# Patient Record
Sex: Female | Born: 1960 | Hispanic: Yes | Marital: Single | Smoking: Never smoker
Health system: Southern US, Community
[De-identification: ages and names within clinical notes are randomized; demographics above are authoritative.]

## PROBLEM LIST (undated history)

## (undated) DIAGNOSIS — J45909 Unspecified asthma, uncomplicated: Secondary | ICD-10-CM

## (undated) HISTORY — PX: CHOLECYSTECTOMY: SHX55

---

## 2004-06-03 ENCOUNTER — Emergency Department: Payer: Self-pay | Admitting: Emergency Medicine

## 2004-06-03 ENCOUNTER — Other Ambulatory Visit: Payer: Self-pay

## 2004-06-03 ENCOUNTER — Emergency Department: Payer: Self-pay | Admitting: General Practice

## 2004-06-07 ENCOUNTER — Emergency Department: Payer: Self-pay | Admitting: Unknown Physician Specialty

## 2004-06-07 ENCOUNTER — Other Ambulatory Visit: Payer: Self-pay

## 2004-06-29 ENCOUNTER — Other Ambulatory Visit: Payer: Self-pay

## 2004-06-29 ENCOUNTER — Emergency Department: Payer: Self-pay | Admitting: Emergency Medicine

## 2004-07-25 ENCOUNTER — Other Ambulatory Visit: Payer: Self-pay

## 2004-07-25 ENCOUNTER — Emergency Department: Payer: Self-pay | Admitting: Emergency Medicine

## 2004-11-14 ENCOUNTER — Emergency Department: Payer: Self-pay | Admitting: Emergency Medicine

## 2004-11-16 ENCOUNTER — Emergency Department: Payer: Self-pay | Admitting: Unknown Physician Specialty

## 2005-10-12 IMAGING — CR DG CHEST 2V
1 series · 2 of 2 positions shown · non-contrast
Comparison: none

REASON FOR EXAM: pain
COMMENTS:

PROCEDURE:     DXR - DXR CHEST PA (OR AP) AND LATERAL  - November 16, 2004  [DATE]
RESULT:       The lungs are clear.  Cardiovascular structures are
unremarkable.

[Series 1: view not recorded · 0.17mm/px · 2 of 2 slices shown]
[im 1/2]
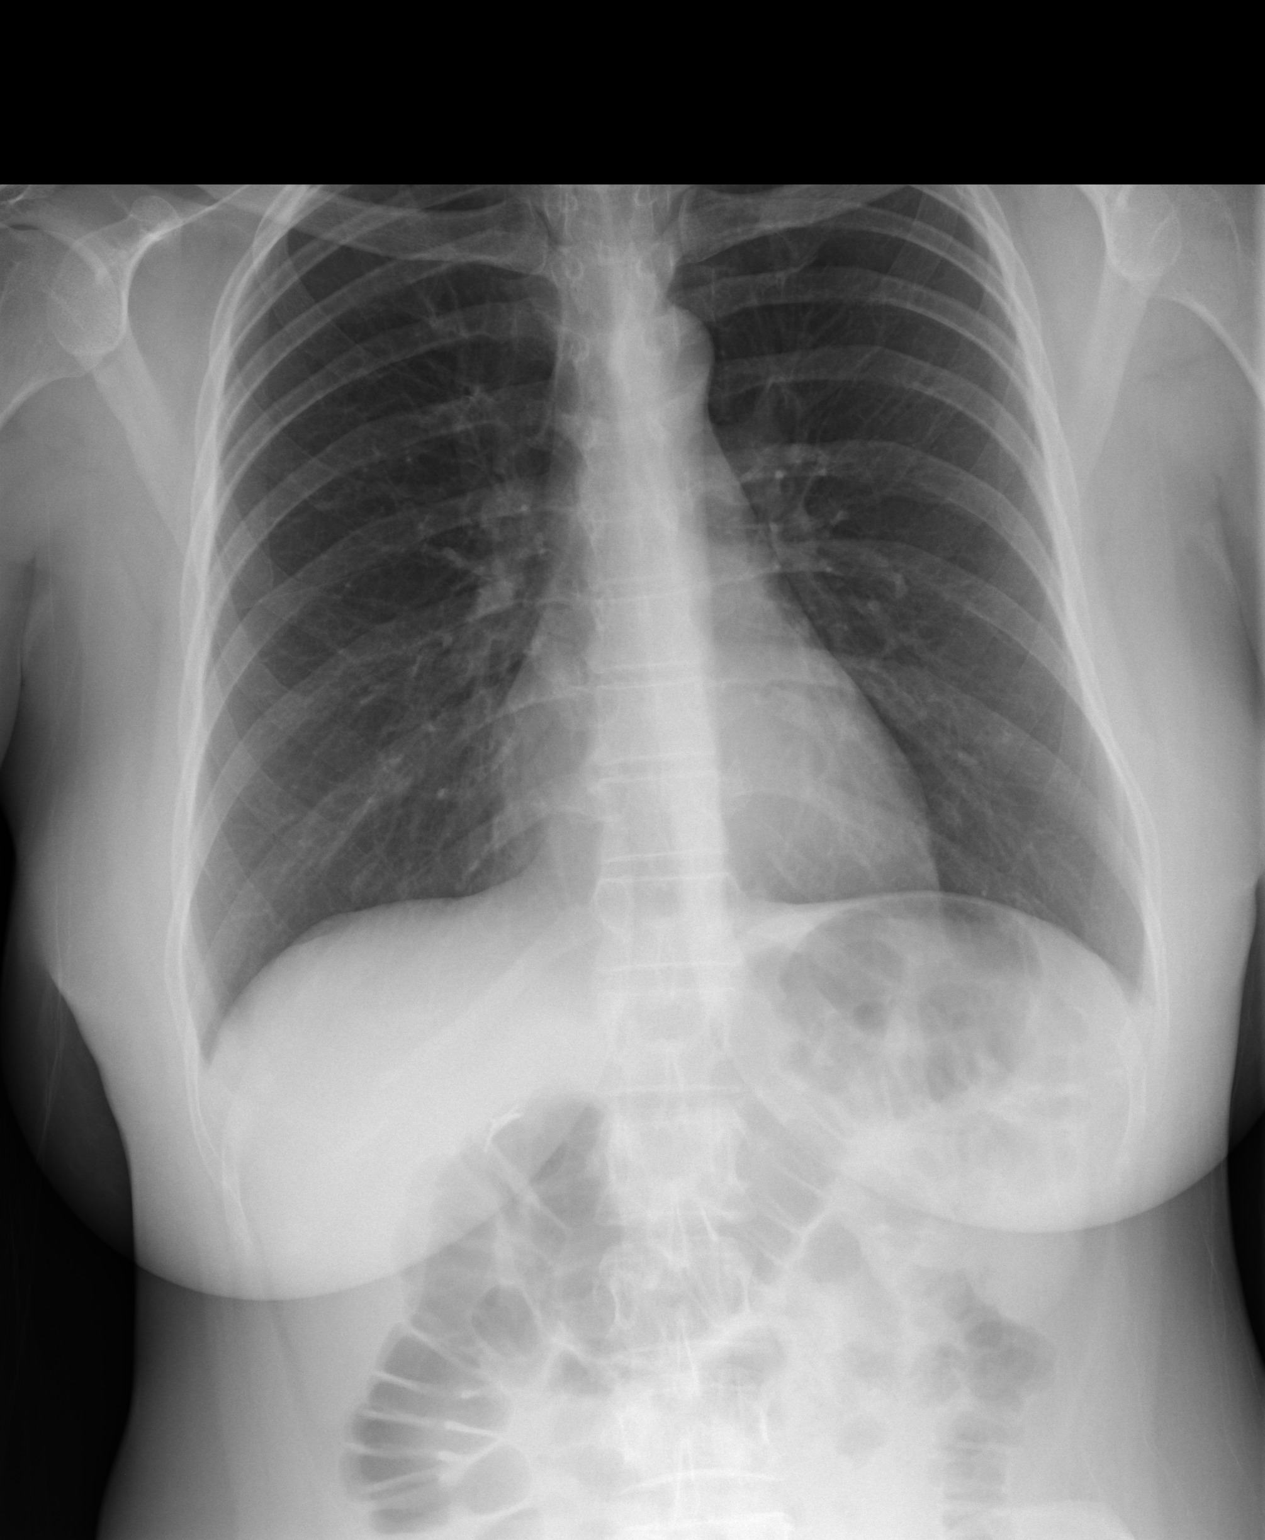
[im 2/2]
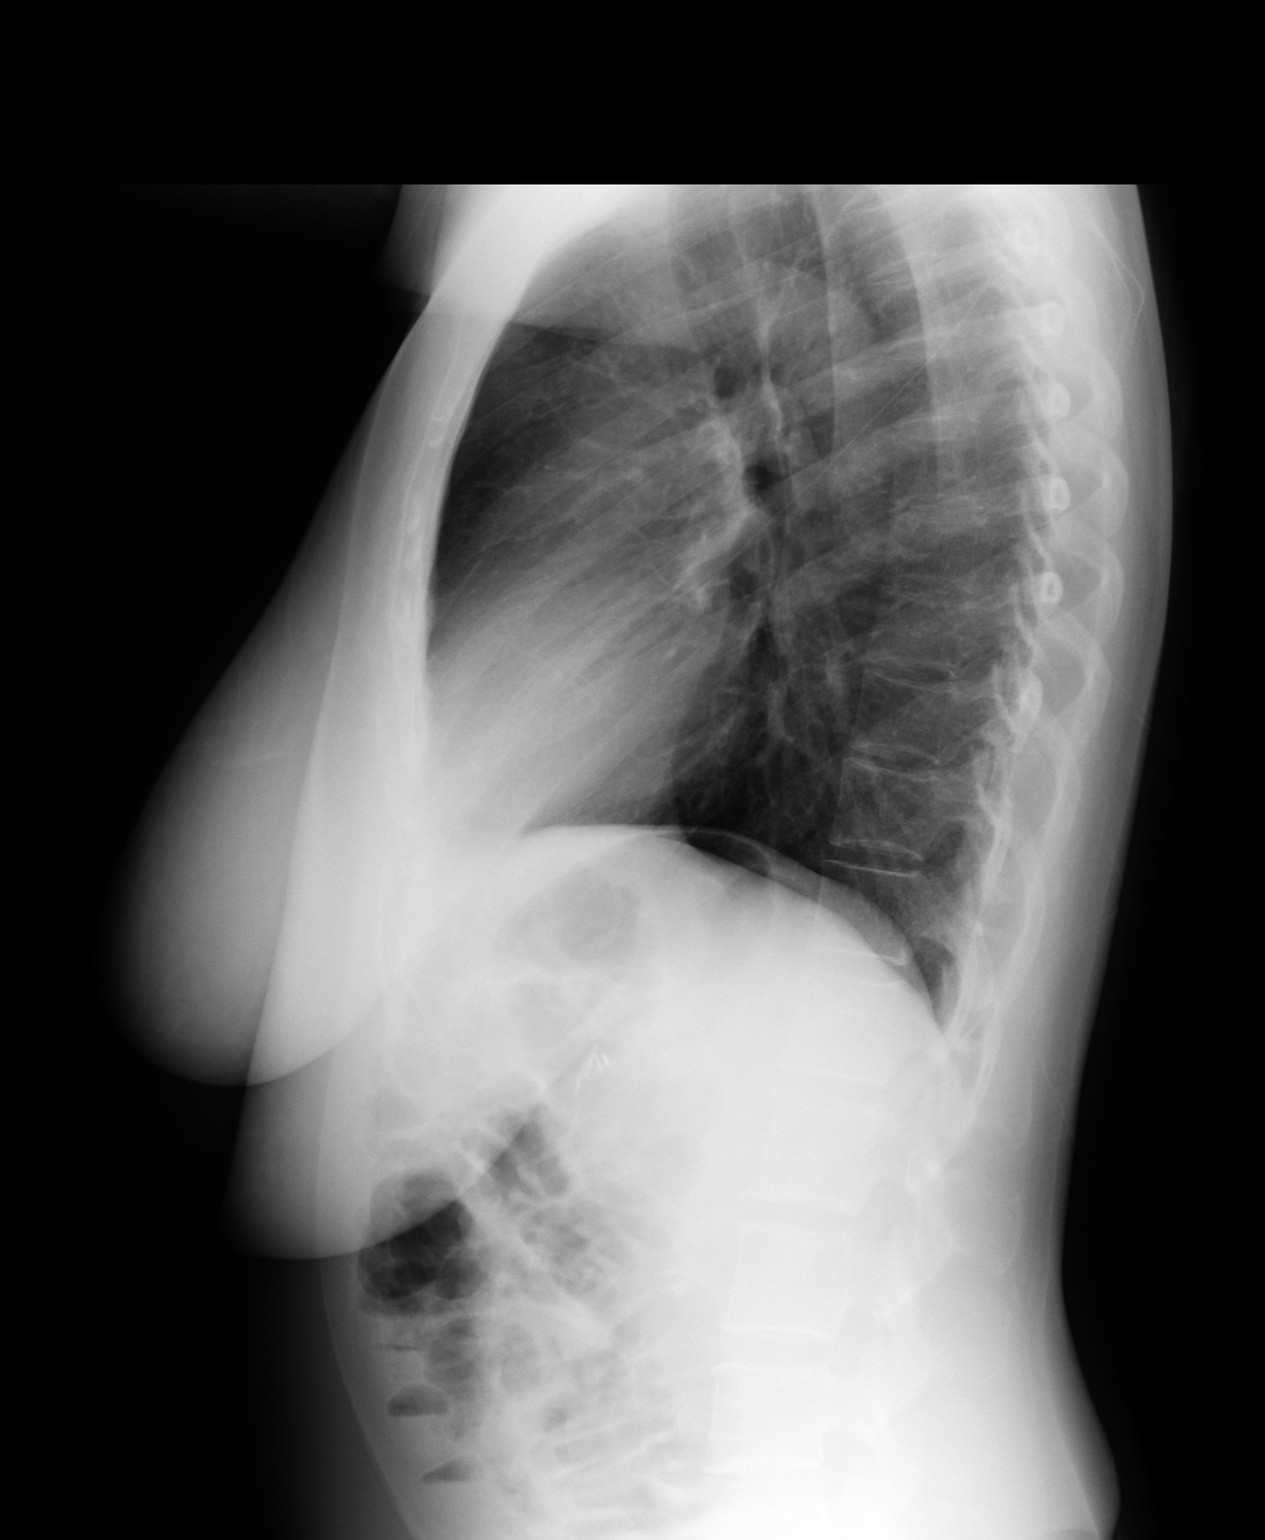

[2 of 2 positions shown; findings below may reference images not displayed]

IMPRESSION: No acute cardiopulmonary disease.

## 2006-01-20 ENCOUNTER — Emergency Department: Payer: Self-pay | Admitting: Emergency Medicine

## 2006-02-04 ENCOUNTER — Ambulatory Visit: Payer: Self-pay | Admitting: Family Medicine

## 2006-10-15 ENCOUNTER — Ambulatory Visit: Payer: Self-pay

## 2006-12-15 ENCOUNTER — Emergency Department: Payer: Self-pay | Admitting: Emergency Medicine

## 2007-03-18 ENCOUNTER — Other Ambulatory Visit: Payer: Self-pay

## 2007-03-18 ENCOUNTER — Emergency Department: Payer: Self-pay | Admitting: Emergency Medicine

## 2008-01-16 ENCOUNTER — Other Ambulatory Visit: Payer: Self-pay

## 2008-01-16 ENCOUNTER — Emergency Department: Payer: Self-pay | Admitting: Emergency Medicine

## 2008-03-17 ENCOUNTER — Ambulatory Visit: Payer: Self-pay | Admitting: Family Medicine

## 2008-10-09 ENCOUNTER — Inpatient Hospital Stay: Payer: Self-pay | Admitting: Internal Medicine

## 2013-08-29 ENCOUNTER — Emergency Department: Payer: Self-pay | Admitting: Emergency Medicine

## 2013-08-29 LAB — BASIC METABOLIC PANEL
Anion Gap: 4 — ABNORMAL LOW (ref 7–16)
BUN: 7 mg/dL (ref 7–18)
CO2: 28 mmol/L (ref 21–32)
Calcium, Total: 8.8 mg/dL (ref 8.5–10.1)
Chloride: 105 mmol/L (ref 98–107)
Creatinine: 0.73 mg/dL (ref 0.60–1.30)
EGFR (African American): >60
EGFR (Non-African Amer.): >60
Glucose: 88 mg/dL (ref 65–99)
Osmolality: 271 (ref 275–301)
Potassium: 3.5 mmol/L (ref 3.5–5.1)
Sodium: 137 mmol/L (ref 136–145)

## 2013-08-29 LAB — CBC
HCT: 40.5 % (ref 35.0–47.0)
HGB: 13.4 g/dL (ref 12.0–16.0)
MCH: 28.2 pg (ref 26.0–34.0)
MCHC: 33 g/dL (ref 32.0–36.0)
MCV: 85 fL (ref 80–100)
Platelet: 229 10*3/uL (ref 150–440)
RBC: 4.73 10*6/uL (ref 3.80–5.20)
RDW: 14.7 % — ABNORMAL HIGH (ref 11.5–14.5)
WBC: 7.7 10*3/uL (ref 3.6–11.0)

## 2013-08-29 LAB — TROPONIN I: Troponin-I: <0.02 ng/mL

## 2013-09-03 ENCOUNTER — Emergency Department: Payer: Self-pay | Admitting: Emergency Medicine

## 2013-09-04 LAB — URINALYSIS, COMPLETE
Bacteria: NONE SEEN
Bilirubin,UR: NEGATIVE
Blood: NEGATIVE
GLUCOSE, UR: NEGATIVE mg/dL (ref 0–75)
KETONE: NEGATIVE
Leukocyte Esterase: NEGATIVE
NITRITE: NEGATIVE
PH: 7 (ref 4.5–8.0)
Protein: NEGATIVE
RBC,UR: 1 /HPF (ref 0–5)
Specific Gravity: 1.014 (ref 1.003–1.030)
Squamous Epithelial: <1
WBC UR: <1 /HPF (ref 0–5)

## 2014-03-07 ENCOUNTER — Emergency Department: Payer: Self-pay | Admitting: Emergency Medicine

## 2014-03-07 LAB — COMPREHENSIVE METABOLIC PANEL
ANION GAP: 6 — AB (ref 7–16)
AST: 33 U/L (ref 15–37)
Albumin: 3.9 g/dL (ref 3.4–5.0)
Alkaline Phosphatase: 103 U/L
BUN: 11 mg/dL (ref 7–18)
Bilirubin,Total: 0.5 mg/dL (ref 0.2–1.0)
CALCIUM: 8.7 mg/dL (ref 8.5–10.1)
CREATININE: 0.84 mg/dL (ref 0.60–1.30)
Chloride: 107 mmol/L (ref 98–107)
Co2: 26 mmol/L (ref 21–32)
EGFR (African American): >60
GLUCOSE: 94 mg/dL (ref 65–99)
Osmolality: 277 (ref 275–301)
POTASSIUM: 3.9 mmol/L (ref 3.5–5.1)
SGPT (ALT): 30 U/L (ref 12–78)
Sodium: 139 mmol/L (ref 136–145)
TOTAL PROTEIN: 8.5 g/dL — AB (ref 6.4–8.2)

## 2014-03-07 LAB — CBC
HCT: 42 % (ref 35.0–47.0)
HGB: 13.2 g/dL (ref 12.0–16.0)
MCH: 27.5 pg (ref 26.0–34.0)
MCHC: 31.5 g/dL — ABNORMAL LOW (ref 32.0–36.0)
MCV: 87 fL (ref 80–100)
PLATELETS: 238 10*3/uL (ref 150–440)
RBC: 4.81 10*6/uL (ref 3.80–5.20)
RDW: 14.8 % — ABNORMAL HIGH (ref 11.5–14.5)
WBC: 7 10*3/uL (ref 3.6–11.0)

## 2014-03-07 LAB — TROPONIN I: Troponin-I: <0.02 ng/mL

## 2014-03-07 LAB — CK TOTAL AND CKMB (NOT AT ARMC)
CK, TOTAL: 105 U/L
CK-MB: 0.7 ng/mL (ref 0.5–3.6)

## 2014-06-05 ENCOUNTER — Emergency Department: Payer: Self-pay | Admitting: Emergency Medicine

## 2014-08-01 ENCOUNTER — Emergency Department: Payer: Self-pay | Admitting: Emergency Medicine

## 2015-01-25 ENCOUNTER — Ambulatory Visit: Payer: Self-pay

## 2015-06-21 ENCOUNTER — Ambulatory Visit: Payer: Self-pay | Attending: Oncology

## 2015-06-21 ENCOUNTER — Ambulatory Visit
Admission: RE | Admit: 2015-06-21 | Discharge: 2015-06-21 | Disposition: A | Discharge status: Home / Self Care | Payer: Self-pay | Source: Ambulatory Visit | Attending: Oncology | Admitting: Oncology

## 2015-06-21 VITALS — BP 122/83 | HR 99 | Temp 98.4°F | Resp 18 | Ht 61.02 in | Wt 144.2 lb

## 2015-06-21 DIAGNOSIS — Z0000 Encounter for general adult medical examination without abnormal findings: Secondary | ICD-10-CM

## 2015-06-21 NOTE — Progress Notes (Signed)
Subjective:     Patient ID: Vicki Ellis, female   DOB: 1960-12-17, 54 y.o.   MRN: [id number]  HPI   Review of Systems     Objective:   Physical Exam  Pulmonary/Chest: Right breast exhibits no inverted nipple, no mass, no nipple discharge, no skin change and no tenderness. Left breast exhibits no inverted nipple, no mass, no nipple discharge, no skin change and no tenderness. Breasts are asymmetrical.  Right breast 1/2 cup size larger than left       Assessment:  54 year old hispanic patient , fluent in [place],  presents for [facility] clinic visit. Patient screened, and meets [facility] eligibility.  Patient does not have insurance, Medicare or Medicaid.  Handout given on Affordable Care Act.  CBE unremarkable.  Instructed patient on breast self-exam using teach back method    Plan:     Sent for bilateral screening mammogram.

## 2015-07-04 NOTE — Progress Notes (Signed)
Letter mailed from [facility] to notify of normal mammogram results.  Patient to return in one year for annual screening.  Copy to [facility]. 

## 2015-08-31 ENCOUNTER — Emergency Department
Admission: EM | Admit: 2015-08-31 | Discharge: 2015-09-01 | Disposition: A | Discharge status: Home / Self Care | Payer: Self-pay | Attending: Emergency Medicine | Admitting: Emergency Medicine

## 2015-08-31 ENCOUNTER — Emergency Department: Payer: Self-pay

## 2015-08-31 DIAGNOSIS — R112 Nausea with vomiting, unspecified: Secondary | ICD-10-CM | POA: Insufficient documentation

## 2015-08-31 DIAGNOSIS — R0781 Pleurodynia: Secondary | ICD-10-CM | POA: Insufficient documentation

## 2015-08-31 DIAGNOSIS — R059 Cough, unspecified: Secondary | ICD-10-CM

## 2015-08-31 DIAGNOSIS — R05 Cough: Secondary | ICD-10-CM | POA: Insufficient documentation

## 2015-08-31 DIAGNOSIS — R197 Diarrhea, unspecified: Secondary | ICD-10-CM | POA: Insufficient documentation

## 2015-08-31 DIAGNOSIS — R109 Unspecified abdominal pain: Secondary | ICD-10-CM | POA: Insufficient documentation

## 2015-08-31 HISTORY — DX: Unspecified asthma, uncomplicated: J45.909

## 2015-08-31 LAB — COMPREHENSIVE METABOLIC PANEL
ALT: 20 U/L (ref 14–54)
AST: 23 U/L (ref 15–41)
Albumin: 4.4 g/dL (ref 3.5–5.0)
Alkaline Phosphatase: 111 U/L (ref 38–126)
Anion gap: 8 (ref 5–15)
BUN: 8 mg/dL (ref 6–20)
CHLORIDE: 104 mmol/L (ref 101–111)
CO2: 27 mmol/L (ref 22–32)
CREATININE: 0.74 mg/dL (ref 0.44–1.00)
Calcium: 9.5 mg/dL (ref 8.9–10.3)
GFR calc non Af Amer: >60 mL/min (ref >60)
Glucose, Bld: 99 mg/dL (ref 65–99)
Potassium: 3.8 mmol/L (ref 3.5–5.1)
SODIUM: 139 mmol/L (ref 135–145)
Total Bilirubin: 0.7 mg/dL (ref 0.3–1.2)
Total Protein: 8.5 g/dL — ABNORMAL HIGH (ref 6.5–8.1)

## 2015-08-31 LAB — URINALYSIS COMPLETE WITH MICROSCOPIC (ARMC ONLY)
BILIRUBIN URINE: NEGATIVE
Glucose, UA: NEGATIVE mg/dL
HGB URINE DIPSTICK: NEGATIVE
Ketones, ur: NEGATIVE mg/dL
LEUKOCYTES UA: NEGATIVE
NITRITE: NEGATIVE
PH: 7 (ref 5.0–8.0)
PROTEIN: NEGATIVE mg/dL
Specific Gravity, Urine: 1.004 — ABNORMAL LOW (ref 1.005–1.030)

## 2015-08-31 LAB — CBC
HCT: 43.6 % (ref 35.0–47.0)
Hemoglobin: 14.2 g/dL (ref 12.0–16.0)
MCH: 27.9 pg (ref 26.0–34.0)
MCHC: 32.6 g/dL (ref 32.0–36.0)
MCV: 85.5 fL (ref 80.0–100.0)
PLATELETS: 232 10*3/uL (ref 150–440)
RBC: 5.1 MIL/uL (ref 3.80–5.20)
RDW: 14.4 % (ref 11.5–14.5)
WBC: 7.6 10*3/uL (ref 3.6–11.0)

## 2015-08-31 LAB — LIPASE, BLOOD: Lipase: 36 U/L (ref 11–51)

## 2015-08-31 MED ORDER — ONDANSETRON HCL 4 MG/2ML IJ SOLN
4.0000 mg | Freq: Once | INTRAMUSCULAR | Status: AC
  Administered 2015-08-31: 4 mg via INTRAVENOUS
  Filled 2015-08-31: qty 2

## 2015-08-31 MED ORDER — KETOROLAC TROMETHAMINE 30 MG/ML IJ SOLN
30.0000 mg | Freq: Once | INTRAMUSCULAR | Status: AC
Start: 2015-08-31 — End: 2015-08-31
  Administered 2015-08-31: 30 mg via INTRAVENOUS
  Filled 2015-08-31: qty 1

## 2015-08-31 MED ORDER — SODIUM CHLORIDE 0.9 % IV BOLUS (SEPSIS)
500.0000 mL | Freq: Once | INTRAVENOUS | Status: AC
  Administered 2015-08-31: 500 mL via INTRAVENOUS

## 2015-08-31 NOTE — ED Notes (Signed)
Patient transported to X-ray 

## 2015-08-31 NOTE — ED Notes (Addendum)
Flank pain on left side, radiates into back and goes down left leg. Present x 3 days. Denies dysuria or hematuria. Vomiting today x 1. Denies  Fever.

## 2015-08-31 NOTE — ED Provider Notes (Signed)
[facility] Emergency Department Provider Note  ____________________________________________  Time seen: Approximately 11:21 PM  I have reviewed the triage vital signs and the nursing notes.   HISTORY  Chief Complaint Flank Pain    HPI Ksenia Kunz is a 55 y.o. female who presents to the ED from home with a chief complaint of left side pain. Patient complains of left side pain 3 days. Describes sharp pain to left lower ribs and left side which occasionally radiates into her back and goes down her left leg. Denies recent travel or trauma. Vomited 1 today. Denies associated fever, chills, chest pain, shortness of breath, dysuria, hematuria. Does complain of a nonproductive cough which was worse last week as well as diarrhea. Nothing makes her symptoms better. Movement coughing make her symptoms worse.Denies history of kidney stones.   Past Medical History  Diagnosis Date  . Asthma     There are no active problems to display for this patient.   Past Surgical History  Procedure Laterality Date  . Cesarean section    . Cholecystectomy      No current outpatient prescriptions on file.  Allergies Oxycodone-acetaminophen; Propoxyphene; and Tramadol  No family history on file.  Social History Social History  Substance Use Topics  . Smoking status: Never Smoker   . Smokeless tobacco: Never Used  . Alcohol Use: 0.0 oz/week    0 Standard drinks or equivalent per week    Review of Systems Constitutional: No fever/chills Eyes: No visual changes. ENT: No sore throat. Cardiovascular: Denies chest pain. Respiratory: Denies shortness of breath. Gastrointestinal: Positive for abdominal pain.  Positive for nausea and vomiting x 1.  Positive for diarrhea.  No constipation. Genitourinary: Negative for dysuria. Musculoskeletal: Negative for back pain. Skin: Negative for rash. Neurological: Negative for headaches, focal weakness or numbness.  10-point  ROS otherwise negative.  ____________________________________________   PHYSICAL EXAM:  VITAL SIGNS: ED Triage Vitals  Enc Vitals Group     BP 08/31/15 2204 143/86 mmHg     Pulse Rate 08/31/15 2204 95     Resp 08/31/15 2204 20     Temp 08/31/15 2204 98.8 F (37.1 C)     Temp Source 08/31/15 2204 Oral     SpO2 08/31/15 2204 98 %     Weight 08/31/15 2204 155 lb (70.308 kg)     Height 08/31/15 2204 5\' 5"  (1.651 m)     Head Cir --      Peak Flow --      Pain Score 08/31/15 2205 9     Pain Loc --      Pain Edu? --      Excl. in GC? --     Constitutional: Alert and oriented. Well appearing and in no acute distress. Eyes: Conjunctivae are normal. PERRL. EOMI. Head: Atraumatic. Nose: No congestion/rhinnorhea. Mouth/Throat: Mucous membranes are moist.  Oropharynx non-erythematous. Neck: No stridor.   Cardiovascular: Normal rate, regular rhythm. Grossly normal heart sounds.  Good peripheral circulation. Respiratory: Normal respiratory effort.  No retractions. Lungs CTAB. Left lower ribs tender to palpation. Pain on movement of trunk. Gastrointestinal: Soft and nontender to light and deep palpation. No distention. No abdominal bruits. No CVA tenderness. Musculoskeletal: Back is nontender to palpation or to movements. No lower extremity tenderness nor edema.  No joint effusions. Neurologic:  Normal speech and language. No gross focal neurologic deficits are appreciated. No gait instability. Skin:  Skin is warm, dry and intact. No rash noted. Psychiatric: Mood and affect are  normal. Speech and behavior are normal.  ____________________________________________   LABS (all labs ordered are listed, but only abnormal results are displayed)  Labs Reviewed  URINALYSIS COMPLETEWITH MICROSCOPIC ([facility] ONLY) - Abnormal; Notable for the following:    Color, Urine STRAW (*)    APPearance CLEAR (*)    Specific Gravity, Urine 1.004 (*)    Bacteria, UA RARE (*)    Squamous Epithelial / LPF  0-5 (*)    All other components within normal limits  COMPREHENSIVE METABOLIC PANEL - Abnormal; Notable for the following:    Total Protein 8.5 (*)    All other components within normal limits  CBC  LIPASE, BLOOD   ____________________________________________  EKG  None ____________________________________________  RADIOLOGY  Chest 2 view (view by me, interpreted per Dr. Gwenyth Benderadparvar): No active cardiopulmonary disease. ____________________________________________   PROCEDURES  Procedure(s) performed: None  Critical Care performed: No  ____________________________________________   INITIAL IMPRESSION / ASSESSMENT AND PLAN / ED COURSE  Pertinent labs & imaging results that were available during my care of the patient were reviewed by me and considered in my medical decision making (see chart for details).  55 year old female who presents with left lower rib pain and coughing. Laboratory and urinalysis results are unremarkable. Will obtain chest x-ray; administer analgesia and reassess.  ----------------------------------------- 1:09 AM on 09/01/2015 -----------------------------------------  Patient is feeling much better, smiling and joking with family members. Updated patient of imaging results. Strict precautions given. Patient and family verbalize understanding and agree with plan of care. ____________________________________________   FINAL CLINICAL IMPRESSION(S) / ED DIAGNOSES  Final diagnoses:  Rib pain on left side  Cough      Irean HongJade J Sung, MD 09/01/15 [phone]

## 2015-09-01 MED ORDER — NAPROXEN 500 MG PO TABS: 500.0000 mg | ORAL_TABLET | Freq: Two times a day (BID) | ORAL | Status: AC

## 2015-09-01 NOTE — Discharge Instructions (Signed)
1. You may take Naprosyn as needed for pain. 2. Apply moist heat to affected area several times daily. 3. Return to the ER for worsening symptoms, persistent vomiting, difficulty breathing or other concerns.  Dolor en la pared torcica (Chest Wall Pain) El dolor en la pared torcica se produce en los huesos y los msculos del pecho o alrededor de Scientist, product/process development. A veces, una lesin Occupational psychologist. En ocasiones, la causa puede ser desconocida. Este dolor puede durar varias semanas. INSTRUCCIONES PARA EL CUIDADO EN EL HOGAR  Est atento a cualquier cambio en los sntomas. Tome estas medidas para Acupuncturist dolor:   Haga reposo como se lo haya indicado el mdico.   Evite las actividades que causan dolor. Estas pueden ser [employer] requieren el uso de los msculos del trax, los abdominales o los laterales para levantar objetos pesados.   Si se lo indican, aplique hielo sobre la zona dolorida:  Ponga el hielo en una bolsa plstica.  Coloque una toalla entre la piel y la bolsa de hielo.  Coloque el hielo durante , 2 a 3veces por Futures trader.  Tome los medicamentos de venta libre y los recetados solamente como se lo haya indicado el mdico.  No consuma productos que contengan tabaco, incluidos cigarrillos, tabaco de Theatre manager y Administrator, Civil Service. Si necesita ayuda para dejar de fumar, consulte al mdico.  Concurra a todas las visitas de control como se lo haya indicado el mdico. Esto es importante. SOLICITE ATENCIN MDICA SI:  Lance Muss.  El dolor de [place].  Aparecen nuevos sntomas. SOLICITE ATENCIN MDICA DE INMEDIATO SI:  Tiene nuseas o vmitos.  Berenice Primas o tiene sensacin de desvanecimiento.  Tiene tos con flema (esputo) o expectora sangre al toser.  Le falta el aire.   Esta informacin no tiene Theme park manager el consejo del mdico. Asegrese de hacerle al mdico cualquier pregunta que tenga.   Document Released: 09/16/2006 Document Revised:  04/26/2015 Elsevier Interactive Patient Education 2016 [employer].  Tos en los adultos (Cough, Adult) La tos ayuda a limpiar la garganta y los pulmones. La tos puede durar solo 2 o 3semanas (aguda) o ms de 8semanas (crnica). Las causas de la tos son [place]. Puede ser el signo de [place] enfermedad o de otro trastorno. CUIDADOS EN EL HOGAR  Est atento a cualquier cambio en la tos.  Tome los medicamentos solamente como se lo haya indicado el mdico.  Si le recetaron un antibitico, tmelo como se lo haya indicado el mdico. No deje de tomarlo aunque comience a sentirse mejor.  Hable con el mdico antes de probar un medicamento para la tos.  Beba suficiente lquido para mantener el pis (orina) claro o de color amarillo plido.  Si el aire est seco, use un vaporizador o un humidificador con vapor fro en su casa.  Mantngase alejado de las cosas que lo hacen toser en el trabajo o en su casa.  Si la tos aumenta durante la noche, haga la prueba de usar almohadas adicionales para Pharmacologist la cabeza ms elevada mientras duerme.  No fume e intente no estar cerca de humo. Si necesita ayuda para dejar de fumar, consulte al mdico.  No consuma cafena.  No beba alcohol.  Descanse todo lo que sea necesario. SOLICITE AYUDA SI:  Le aparecen problemas (sntomas) nuevos.  Expectora un lquido amarillento (pus) cuando tose.  La tos no mejora despus de 2 o 3semanas, o empeora.  Los medicamentos no [employer] tos, y no Stage manager.  Siente un dolor que se vuelve ms intenso o que no se BJ'salivia con los medicamentos.  Tiene fiebre.  Est bajando de peso y no sabe por qu.  Tiene transpiracin nocturna. SOLICITE AYUDA DE INMEDIATO SI:  Tose y escupe sangre.  Tiene dificultad para respirar.  Los latidos cardacos son muy rpidos.   Esta informacin no tiene Theme park managercomo fin reemplazar el consejo del mdico. Asegrese de hacerle al mdico cualquier pregunta que tenga.     Document Released: 04/18/2011 Document Revised: 04/26/2015 Elsevier Interactive Patient Education [employer]2016 Elsevier Inc.

## 2015-09-12 ENCOUNTER — Inpatient Hospital Stay
Admission: EM | Admit: 2015-09-12 | Discharge: 2015-09-14 | DRG: 057 | Disposition: A | Discharge status: Home / Self Care | Payer: Self-pay | Attending: Internal Medicine | Admitting: Internal Medicine

## 2015-09-12 ENCOUNTER — Emergency Department: Payer: Self-pay

## 2015-09-12 ENCOUNTER — Inpatient Hospital Stay: Payer: Self-pay

## 2015-09-12 ENCOUNTER — Encounter: Payer: Self-pay | Admitting: Emergency Medicine

## 2015-09-12 DIAGNOSIS — G43909 Migraine, unspecified, not intractable, without status migrainosus: Secondary | ICD-10-CM | POA: Diagnosis present

## 2015-09-12 DIAGNOSIS — R531 Weakness: Secondary | ICD-10-CM | POA: Diagnosis present

## 2015-09-12 DIAGNOSIS — G8194 Hemiplegia, unspecified affecting left nondominant side: Principal | ICD-10-CM | POA: Diagnosis present

## 2015-09-12 DIAGNOSIS — F419 Anxiety disorder, unspecified: Secondary | ICD-10-CM | POA: Diagnosis present

## 2015-09-12 DIAGNOSIS — J45909 Unspecified asthma, uncomplicated: Secondary | ICD-10-CM | POA: Diagnosis present

## 2015-09-12 DIAGNOSIS — Z9049 Acquired absence of other specified parts of digestive tract: Secondary | ICD-10-CM

## 2015-09-12 DIAGNOSIS — Z79899 Other long term (current) drug therapy: Secondary | ICD-10-CM

## 2015-09-12 DIAGNOSIS — I639 Cerebral infarction, unspecified: Secondary | ICD-10-CM

## 2015-09-12 DIAGNOSIS — Z8249 Family history of ischemic heart disease and other diseases of the circulatory system: Secondary | ICD-10-CM

## 2015-09-12 DIAGNOSIS — Z7982 Long term (current) use of aspirin: Secondary | ICD-10-CM

## 2015-09-12 LAB — CBC WITH DIFFERENTIAL/PLATELET
BASOS PCT: 1 %
Basophils Absolute: 0 10*3/uL (ref 0–0.1)
EOS ABS: 0.1 10*3/uL (ref 0–0.7)
Eosinophils Relative: 1 %
HCT: 41.7 % (ref 35.0–47.0)
HEMOGLOBIN: 13.6 g/dL (ref 12.0–16.0)
Lymphocytes Relative: 36 %
Lymphs Abs: 2.4 10*3/uL (ref 1.0–3.6)
MCH: 27.8 pg (ref 26.0–34.0)
MCHC: 32.7 g/dL (ref 32.0–36.0)
MCV: 84.9 fL (ref 80.0–100.0)
MONO ABS: 0.6 10*3/uL (ref 0.2–0.9)
MONOS PCT: 9 %
NEUTROS PCT: 53 %
Neutro Abs: 3.5 10*3/uL (ref 1.4–6.5)
Platelets: 222 10*3/uL (ref 150–440)
RBC: 4.91 MIL/uL (ref 3.80–5.20)
RDW: 14.1 % (ref 11.5–14.5)
WBC: 6.6 10*3/uL (ref 3.6–11.0)

## 2015-09-12 LAB — URINALYSIS COMPLETE WITH MICROSCOPIC (ARMC ONLY)
BILIRUBIN URINE: NEGATIVE
Bacteria, UA: NONE SEEN
GLUCOSE, UA: NEGATIVE mg/dL
Hgb urine dipstick: NEGATIVE
KETONES UR: NEGATIVE mg/dL
Leukocytes, UA: NEGATIVE
Nitrite: NEGATIVE
Protein, ur: NEGATIVE mg/dL
SPECIFIC GRAVITY, URINE: 1.019 (ref 1.005–1.030)
pH: 7 (ref 5.0–8.0)

## 2015-09-12 LAB — COMPREHENSIVE METABOLIC PANEL
ALK PHOS: 106 U/L (ref 38–126)
ALT: 18 U/L (ref 14–54)
AST: 21 U/L (ref 15–41)
Albumin: 4.3 g/dL (ref 3.5–5.0)
Anion gap: 9 (ref 5–15)
BILIRUBIN TOTAL: 0.7 mg/dL (ref 0.3–1.2)
BUN: 10 mg/dL (ref 6–20)
CALCIUM: 9.4 mg/dL (ref 8.9–10.3)
CO2: 27 mmol/L (ref 22–32)
CREATININE: 0.64 mg/dL (ref 0.44–1.00)
Chloride: 104 mmol/L (ref 101–111)
GFR calc non Af Amer: >60 mL/min (ref >60)
Glucose, Bld: 102 mg/dL — ABNORMAL HIGH (ref 65–99)
Potassium: 3.8 mmol/L (ref 3.5–5.1)
SODIUM: 140 mmol/L (ref 135–145)
Total Protein: 8.1 g/dL (ref 6.5–8.1)

## 2015-09-12 LAB — CK: CK TOTAL: 61 U/L (ref 38–234)

## 2015-09-12 MED ORDER — DIAZEPAM 5 MG/ML IJ SOLN
2.5000 mg | Freq: Once | INTRAMUSCULAR | Status: AC
  Administered 2015-09-12: 2.5 mg via INTRAVENOUS
  Filled 2015-09-12: qty 2

## 2015-09-12 MED ORDER — ENOXAPARIN SODIUM 40 MG/0.4ML ~~LOC~~ SOLN
40.0000 mg | SUBCUTANEOUS | Status: DC
  Administered 2015-09-12 – 2015-09-13 (×2): 40 mg via SUBCUTANEOUS
  Filled 2015-09-12 (×3): qty 0.4

## 2015-09-12 MED ORDER — DIPHENHYDRAMINE HCL 50 MG/ML IJ SOLN
INTRAMUSCULAR | Status: AC
  Administered 2015-09-12: 50 mg via INTRAVENOUS
  Filled 2015-09-12: qty 1

## 2015-09-12 MED ORDER — IBUPROFEN 400 MG PO TABS
400.0000 mg | ORAL_TABLET | Freq: Four times a day (QID) | ORAL | Status: DC | PRN
  Administered 2015-09-12 – 2015-09-13 (×2): 400 mg via ORAL
  Filled 2015-09-12 (×3): qty 1

## 2015-09-12 MED ORDER — STROKE: EARLY STAGES OF RECOVERY BOOK
Freq: Once | Status: AC
  Administered 2015-09-12: 18:00:00

## 2015-09-12 MED ORDER — INFLUENZA VAC SPLIT QUAD 0.5 ML IM SUSY
0.5000 mL | PREFILLED_SYRINGE | INTRAMUSCULAR | Status: AC
  Administered 2015-09-13: 11:00:00 0.5 mL via INTRAMUSCULAR
  Filled 2015-09-12: qty 0.5

## 2015-09-12 MED ORDER — TRAZODONE HCL 100 MG PO TABS
100.0000 mg | ORAL_TABLET | Freq: Every day | ORAL | Status: DC
  Administered 2015-09-12: 22:00:00 100 mg via ORAL
  Filled 2015-09-12 (×2): qty 1

## 2015-09-12 MED ORDER — DIPHENHYDRAMINE HCL 50 MG/ML IJ SOLN
50.0000 mg | Freq: Once | INTRAMUSCULAR | Status: AC
  Administered 2015-09-12: 50 mg via INTRAVENOUS

## 2015-09-12 MED ORDER — SODIUM CHLORIDE 0.9 % IV BOLUS (SEPSIS)
1000.0000 mL | Freq: Once | INTRAVENOUS | Status: AC
  Administered 2015-09-12: 1000 mL via INTRAVENOUS

## 2015-09-12 MED ORDER — IOHEXOL 350 MG/ML SOLN
100.0000 mL | Freq: Once | INTRAVENOUS | Status: AC | PRN
  Administered 2015-09-12: 100 mL via INTRAVENOUS

## 2015-09-12 MED ORDER — ASPIRIN 81 MG PO CHEW
324.0000 mg | CHEWABLE_TABLET | Freq: Once | ORAL | Status: AC
  Administered 2015-09-12: 324 mg via ORAL
  Filled 2015-09-12: qty 4

## 2015-09-12 MED ORDER — METHYLPREDNISOLONE SODIUM SUCC 125 MG IJ SOLR
INTRAMUSCULAR | Status: AC
  Administered 2015-09-12: 125 mg via INTRAVENOUS
  Filled 2015-09-12: qty 2

## 2015-09-12 MED ORDER — METHYLPREDNISOLONE SODIUM SUCC 125 MG IJ SOLR
125.0000 mg | Freq: Once | INTRAMUSCULAR | Status: AC
  Administered 2015-09-12: 125 mg via INTRAVENOUS

## 2015-09-12 NOTE — H&P (Signed)
[facility] - [place] [facility]   PATIENT NAME: Vicki Ellis    MR#:  [id number]  DATE OF BIRTH:  1961-05-19  DATE OF ADMISSION:  09/12/2015  PRIMARY CARE PHYSICIAN: Sandrea Hughs, NP   REQUESTING/REFERRING PHYSICIAN: Sharman Cheek M.D.  CHIEF COMPLAINT:   Chief Complaint  Patient presents with  . Weakness    HISTORY OF PRESENT ILLNESS: Lachell Rochette  is a 55 y.o. female with a known history of asthma who presents with complaint of left-sided weakness ongoing for 2 weeks. She reports that she is still able to ambulate. But has trouble with gripping things in her hand. Patient also complains of numbness on the left upper extremity and left lower extremity. CT scan of the head was negative in the ER.  PAST MEDICAL HISTORY:   Past Medical History  Diagnosis Date  . Asthma     PAST SURGICAL HISTORY: Past Surgical History  Procedure Laterality Date  . Cesarean section    . Cholecystectomy      SOCIAL HISTORY:  Social History  Substance Use Topics  . Smoking status: Never Smoker   . Smokeless tobacco: Never Used  . Alcohol Use: 0.6 oz/week    1 Standard drinks or equivalent per week    FAMILY HISTORY:  Family History  Problem Relation Age of Onset  . CAD      DRUG ALLERGIES:  Allergies  Allergen Reactions  . Oxycodone-Acetaminophen Other (See Comments)    Reaction:  Tachycardia  . Propoxyphene Other (See Comments)    Reaction:  Tachycardia  . Acyclovir And Related Other (See Comments)    Reaction:  Unknown   . Iohexol Hives  . Shellfish Allergy Hives  . Tramadol Rash    REVIEW OF SYSTEMS:   CONSTITUTIONAL: No fever, fatigue or weakness.  EYES: No blurred or double vision.  EARS, NOSE, AND THROAT: No tinnitus or ear pain.  RESPIRATORY: No cough, shortness of breath, wheezing or hemoptysis.  CARDIOVASCULAR: No chest pain, orthopnea, edema.  GASTROINTESTINAL: No nausea, vomiting, diarrhea or abdominal pain.  GENITOURINARY: No  dysuria, hematuria.  ENDOCRINE: No polyuria, nocturia,  HEMATOLOGY: No anemia, easy bruising or bleeding SKIN: No rash or lesion. MUSCULOSKELETAL: No joint pain or arthritis.   NEUROLOGIC: Left upper extremity and left lower extremity weakness and numness PSYCHIATRY: No anxiety or depression.   MEDICATIONS AT HOME:  Prior to Admission medications   Medication Sig Start Date End Date Taking? Authorizing Provider  aspirin EC 81 MG tablet Take 162 mg by mouth daily as needed (for chest pain).   Yes Historical Provider, MD  traZODone (DESYREL) 100 MG tablet Take 100 mg by mouth at bedtime.   Yes Historical Provider, MD  naproxen (NAPROSYN) 500 MG tablet Take 1 tablet (500 mg total) by mouth 2 (two) times daily with a meal. Patient not taking: Reported on 09/12/2015 09/01/15   Irean Hong, MD      PHYSICAL EXAMINATION:   VITAL SIGNS: Blood pressure 116/73, pulse 91, temperature 98.8 F (37.1 C), temperature source Oral, resp. rate 18, height 5\' 2"  (1.575 m), weight 64.864 kg (143 lb), last menstrual period 05/22/2015, SpO2 96 %.  GENERAL:  55 y.o.-year-old patient lying in the bed with no acute distress.  EYES: Pupils equal, round, reactive to light and accommodation. No scleral icterus. Extraocular muscles intact.  HEENT: Head atraumatic, normocephalic. Oropharynx and nasopharynx clear.  NECK:  Supple, no jugular venous distention. No thyroid enlargement, no tenderness.  LUNGS: Normal breath sounds  bilaterally, no wheezing, rales,rhonchi or crepitation. No use of accessory muscles of respiration.  CARDIOVASCULAR: S1, S2 normal. No murmurs, rubs, or gallops.  ABDOMEN: Soft, nontender, nondistended. Bowel sounds present. No organomegaly or mass.  EXTREMITIES: No pedal edema, cyanosis, or clubbing.  NEUROLOGIC: Cranial nerves II through XII are intact. Diminished strength in the left upper extremity and lower extremity unclear if this effort base or true pathology Babinski downgoing,   PSYCHIATRIC: The patient is alert and oriented x 3.  SKIN: No obvious rash, lesion, or ulcer.   LABORATORY PANEL:   CBC  Recent Labs Lab 09/12/15 1240  WBC 6.6  HGB 13.6  HCT 41.7  PLT 222  MCV 84.9  MCH 27.8  MCHC 32.7  RDW 14.1  LYMPHSABS 2.4  MONOABS 0.6  EOSABS 0.1  BASOSABS 0.0   ------------------------------------------------------------------------------------------------------------------  Chemistries   Recent Labs Lab 09/12/15 1240  NA 140  K 3.8  CL 104  CO2 27  GLUCOSE 102*  BUN 10  CREATININE 0.64  CALCIUM 9.4  AST 21  ALT 18  ALKPHOS 106  BILITOT 0.7   ------------------------------------------------------------------------------------------------------------------ estimated creatinine clearance is 71.9 mL/min (by C-G formula based on Cr of 0.64). ------------------------------------------------------------------------------------------------------------------ No results for input(s): TSH, T4TOTAL, T3FREE, THYROIDAB in the last 72 hours.  Invalid input(s): FREET3   Coagulation profile No results for input(s): INR, PROTIME in the last 168 hours. ------------------------------------------------------------------------------------------------------------------- No results for input(s): DDIMER in the last 72 hours. -------------------------------------------------------------------------------------------------------------------  Cardiac Enzymes No results for input(s): CKMB, TROPONINI, MYOGLOBIN in the last 168 hours.  Invalid input(s): CK ------------------------------------------------------------------------------------------------------------------ Invalid input(s): POCBNP  ---------------------------------------------------------------------------------------------------------------  Urinalysis    Component Value Date/Time   COLORURINE STRAW* 09/12/2015 1349   COLORURINE Yellow 09/04/2013 0044   APPEARANCEUR CLEAR* 09/12/2015  1349   APPEARANCEUR Clear 09/04/2013 0044   LABSPEC 1.019 09/12/2015 1349   LABSPEC 1.014 09/04/2013 0044   PHURINE 7.0 09/12/2015 1349   PHURINE 7.0 09/04/2013 0044   GLUCOSEU NEGATIVE 09/12/2015 1349   GLUCOSEU Negative 09/04/2013 0044   HGBUR NEGATIVE 09/12/2015 1349   HGBUR Negative 09/04/2013 0044   BILIRUBINUR NEGATIVE 09/12/2015 1349   BILIRUBINUR Negative 09/04/2013 0044   KETONESUR NEGATIVE 09/12/2015 1349   KETONESUR Negative 09/04/2013 0044   PROTEINUR NEGATIVE 09/12/2015 1349   PROTEINUR Negative 09/04/2013 0044   NITRITE NEGATIVE 09/12/2015 1349   NITRITE Negative 09/04/2013 0044   LEUKOCYTESUR NEGATIVE 09/12/2015 1349   LEUKOCYTESUR Negative 09/04/2013 0044     RADIOLOGY: Ct Angio Head W/cm &/or Wo Cm  09/12/2015  CLINICAL DATA:  55 year old female with left side weakness for 1.5 weeks. Dizziness. Headache and left hand numbness. Initial encounter. EXAM: CT ANGIOGRAPHY HEAD AND NECK TECHNIQUE: Multidetector CT imaging of the head and neck was performed using the standard protocol during bolus administration of intravenous contrast. Multiplanar CT image reconstructions and MIPs were obtained to evaluate the vascular anatomy. Carotid stenosis measurements (when applicable) are obtained utilizing NASCET criteria, using the distal internal carotid diameter as the denominator. CONTRAST:  OMNIPAQUE IOHEXOL 350 MG/ML SOLN COMPARISON:  Head CT without contrast 10/09/2008. FINDINGS: CT HEAD Brain: Cerebral volume is normal. No ventriculomegaly. No midline shift, mass effect, or evidence of intracranial mass lesion. No acute intracranial hemorrhage identified. No cortically based acute infarct identified. Calvarium and skull base:  No acute osseous abnormality identified. Paranasal sinuses: Trace right mastoid mucosal thickening or fluid appears not significantly changed. Other Visualized paranasal sinuses and mastoids are clear. Orbits: Visualized orbits and scalp soft tissues are  within normal limits.  CTA NECK Skeleton: Poor posterior dentition right maxillary and left mandible. Otherwise No acute osseous abnormality identified. Other neck: Negative lung apices. No superior mediastinal lymphadenopathy. Thyroid, larynx, pharynx, parapharyngeal spaces, retropharyngeal space, sublingual space, submandibular glands, and parotid glands are within normal limits. No cervical lymphadenopathy. Aortic arch: 4 vessel arch configuration, aberrant origin of the right subclavian artery. No arch atherosclerosis. Right carotid system: Negative. Left carotid system: Negative. 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 Normal right vertebral artery origin. Negative cervical right vertebral artery. No proximal left subclavian artery stenosis. Normal left vertebral artery origin. Fairly codominant vertebral arteries. Negative cervical left vertebral artery. CTA HEAD Posterior circulation: Codominant, negative distal vertebral arteries. Normal PICA origins. Normal vertebrobasilar junction. No basilar artery stenosis. Normal SCA and right PCA origins. Fetal type left PCA origin. Right posterior communicating artery diminutive or absent. Bilateral PCA branches within normal limits. Anterior circulation: Both ICA siphons are patent without stenosis. Minimal cavernous calcified plaque. Ophthalmic and left posterior communicating artery origins are normal. Patent carotid termini. Normal MCA and ACA origins. Diminutive or absent anterior communicating artery. Bilateral ACA branches within normal limits. Left MCA origin, M1 segment, bifurcation, and left MCA branches are within normal limits. Right MCA origin, M1 segment, trifurcation, and right MCA branches are within normal limits. Venous sinuses: Patent. Anatomic variants: Aberrant origin of the right subclavian artery. Fetal type left PCA origin. Delayed phase: No abnormal enhancement identified. IMPRESSION: 1. Negative for age CTA  Head and Neck. Minimal atherosclerosis. Normal anatomic variations including aberrant origin of the right subclavian artery. 2.  Normal CT appearance of the brain. 3. Intermittent poor posterior dentition, recommend dental followup. Electronically Signed   By: Odessa Fleming M.D.   On: 09/12/2015 13:51   Dg Chest 2 View  09/12/2015  CLINICAL DATA:  Increasing weakness on the left side of the body. Dizziness. Shortness of breath. Blurred vision. EXAM: CHEST  2 VIEW COMPARISON:  08/31/2015 FINDINGS: Mild enlargement of the cardiopericardial silhouette without edema. The lungs appear clear. No pleural effusion. Right upper quadrant clips noted. No significant bony abnormality is identified. IMPRESSION: 1. Mild enlargement of the cardiopericardial silhouette, without edema. Electronically Signed   By: Gaylyn Rong M.D.   On: 09/12/2015 13:16   Ct Angio Neck W/cm &/or Wo/cm  09/12/2015  CLINICAL DATA:  55 year old female with left side weakness for 1.5 weeks. Dizziness. Headache and left hand numbness. Initial encounter. EXAM: CT ANGIOGRAPHY HEAD AND NECK TECHNIQUE: Multidetector CT imaging of the head and neck was performed using the standard protocol during bolus administration of intravenous contrast. Multiplanar CT image reconstructions and MIPs were obtained to evaluate the vascular anatomy. Carotid stenosis measurements (when applicable) are obtained utilizing NASCET criteria, using the distal internal carotid diameter as the denominator. CONTRAST:  OMNIPAQUE IOHEXOL 350 MG/ML SOLN COMPARISON:  Head CT without contrast 10/09/2008. FINDINGS: CT HEAD Brain: Cerebral volume is normal. No ventriculomegaly. No midline shift, mass effect, or evidence of intracranial mass lesion. No acute intracranial hemorrhage identified. No cortically based acute infarct identified. Calvarium and skull base:  No acute osseous abnormality identified. Paranasal sinuses: Trace right mastoid mucosal thickening or fluid appears  not significantly changed. Other Visualized paranasal sinuses and mastoids are clear. Orbits: Visualized orbits and scalp soft tissues are within normal limits. CTA NECK Skeleton: Poor posterior dentition right maxillary and left mandible. Otherwise No acute osseous abnormality identified. Other neck: Negative lung apices. No superior mediastinal lymphadenopathy. Thyroid, larynx, pharynx, parapharyngeal spaces, retropharyngeal space, sublingual space, submandibular  glands, and parotid glands are within normal limits. No cervical lymphadenopathy. Aortic arch: 4 vessel arch configuration, aberrant origin of the right subclavian artery. No arch atherosclerosis. Right carotid system: Negative. Left carotid system: Negative. 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 Normal right vertebral artery origin. Negative cervical right vertebral artery. No proximal left subclavian artery stenosis. Normal left vertebral artery origin. Fairly codominant vertebral arteries. Negative cervical left vertebral artery. CTA HEAD Posterior circulation: Codominant, negative distal vertebral arteries. Normal PICA origins. Normal vertebrobasilar junction. No basilar artery stenosis. Normal SCA and right PCA origins. Fetal type left PCA origin. Right posterior communicating artery diminutive or absent. Bilateral PCA branches within normal limits. Anterior circulation: Both ICA siphons are patent without stenosis. Minimal cavernous calcified plaque. Ophthalmic and left posterior communicating artery origins are normal. Patent carotid termini. Normal MCA and ACA origins. Diminutive or absent anterior communicating artery. Bilateral ACA branches within normal limits. Left MCA origin, M1 segment, bifurcation, and left MCA branches are within normal limits. Right MCA origin, M1 segment, trifurcation, and right MCA branches are within normal limits. Venous sinuses: Patent. Anatomic variants: Aberrant origin of the  right subclavian artery. Fetal type left PCA origin. Delayed phase: No abnormal enhancement identified. IMPRESSION: 1. Negative for age CTA Head and Neck. Minimal atherosclerosis. Normal anatomic variations including aberrant origin of the right subclavian artery. 2.  Normal CT appearance of the brain. 3. Intermittent poor posterior dentition, recommend dental followup. Electronically Signed   By: Odessa Fleming M.D.   On: 09/12/2015 13:51   Mr Brain Wo Contrast  09/12/2015  CLINICAL DATA:  55 year old female with left side weakness for 1.5 weeks. Dizziness and headache. Initial encounter. EXAM: MRI HEAD WITHOUT CONTRAST TECHNIQUE: Multiplanar, multiecho pulse sequences of the brain and surrounding structures were obtained without intravenous contrast. COMPARISON:  CTA head and neck 1318 hours today. Head CT 10/09/2008. FINDINGS: Cerebral volume is normal. No restricted diffusion to suggest acute infarction. No midline shift, mass effect, evidence of mass lesion, ventriculomegaly, extra-axial collection or acute intracranial hemorrhage. Cervicomedullary junction and pituitary are within normal limits. Negative visualized cervical spine. Major intracranial vascular flow voids are within normal limits. Wallace Cullens and white matter signal is within normal limits for age throughout the brain. Grossly normal visualized internal auditory structures. Small mucous retention cyst left maxillary sinus. Otherwise paranasal sinuses and mastoids are clear. Negative orbit and scalp soft tissues. IMPRESSION: Normal for age noncontrast MRI appearance of the brain. Electronically Signed   By: Odessa Fleming M.D.   On: 09/12/2015 16:09    EKG: Orders placed or performed in visit on 03/07/14  . EKG 12-Lead    IMPRESSION AND PLAN:  patient is a 55 year old female presents with left upper extremity and left lower extremity numbness and weakness  1. Left upper extremity weakness and lower extremity weakness possibly due to CVA at this time will  treat her with aspirin. We will obtain carotid Dopplers echocardiogram of the heart. Place on telemetry. Obtain MRI of the brain. I will also ask neurology to see the patient.  2. Anxiety continue trazodone  3. Miscellaneous we'll do Lovenox for DVT prophylaxis  All the records are reviewed and case discussed with ED provider. Management plans discussed with the patient, family and they are in agreement.  CODE STATUS:    Code Status Orders        Start     Ordered   09/12/15 1445  Full code   Continuous     09/12/15 1446  Code Status History    Date Active Date Inactive Code Status Order ID Comments User Context   This patient has a current code status but no historical code status.       TOTAL TIME TAKING CARE OF THIS PATIENT: 55 minutes.    Auburn Bilberry M.D on 09/12/2015 at 4:28 PM  Between 7am to 6pm - Pager - [phone]  After 6pm go to [email or web address] - password [email or web address] [facility]  [place]  [facility]  Office  [phone]  CC: Primary care physician; Sandrea Hughs, NP

## 2015-09-12 NOTE — ED Notes (Signed)
Pt with left side weakness that started 1.5 weeks ago. Also c/o dizziness, HA, left hand numbness.

## 2015-09-12 NOTE — ED Provider Notes (Signed)
[facility] Emergency Department Provider Note  ____________________________________________  Time seen: 12:05 PM  I have reviewed the triage vital signs and the nursing notes.   HISTORY  Chief Complaint Weakness    HPI Vicki Ellis is a 55 y.o. female who complains of left-sided weakness of the past 2 or 3 days. She also feels like she gets tired very easily. Pains of left-sided headache as well. This all started about 10 days ago when she started having pain in the left side of the body. This then progressed to some paresthesias, and then has worsened over the last few days to weakness. She is unable to pinpoint exactly the onset of the weakness but it was not today. No trauma. No history of strokes. Denies medical history except for asthma.    Past Medical History  Diagnosis Date  . Asthma      There are no active problems to display for this patient.    Past Surgical History  Procedure Laterality Date  . Cesarean section    . Cholecystectomy       Current Outpatient Rx  Name  Route  Sig  Dispense  Refill  . naproxen (NAPROSYN) 500 MG tablet   Oral   Take 1 tablet (500 mg total) by mouth 2 (two) times daily with a meal.   14 tablet   0      Allergies Oxycodone-acetaminophen; Propoxyphene; Acyclovir and related; Iohexol; Other; and Tramadol   History reviewed. No pertinent family history.  Social History Social History  Substance Use Topics  . Smoking status: Never Smoker   . Smokeless tobacco: Never Used  . Alcohol Use: 0.0 oz/week    0 Standard drinks or equivalent per week    Review of Systems  Constitutional:   No fever or chills. No weight changes Eyes:   No blurry vision or double vision.  ENT:   No sore throat. Cardiovascular:   No chest pain. Respiratory:   No dyspnea or cough. Gastrointestinal:   Negative for abdominal pain, vomiting and diarrhea.  No BRBPR or melena. Genitourinary:   Negative for dysuria,  urinary retention, bloody urine, or difficulty urinating. Musculoskeletal:   Negative for back pain. No joint swelling or pain. Skin:   Negative for rash. Neurological:   Left-sided headache and left-sided weakness and paresthesia. Psychiatric:  No anxiety or depression.   Endocrine:  No hot/cold intolerance, changes in energy, or sleep difficulty.  10-point ROS otherwise negative.  ____________________________________________   PHYSICAL EXAM:  VITAL SIGNS: ED Triage Vitals  Enc Vitals Group     BP 09/12/15 1214 133/79 mmHg     Pulse Rate 09/12/15 1214 78     Resp 09/12/15 1214 15     Temp 09/12/15 1214 98.2 F (36.8 C)     Temp Source 09/12/15 1214 Oral     SpO2 09/12/15 1214 100 %     Weight 09/12/15 1214 143 lb (64.864 kg)     Height 09/12/15 1214  (1.575 m)     Head Cir --      Peak Flow --      Pain Score 09/12/15 1216 7     Pain Loc --      Pain Edu? --      Excl. in GC? --     Vital signs reviewed, nursing assessments reviewed.   Constitutional:   Alert and oriented. Well appearing and in no distress. Eyes:   No scleral icterus. No conjunctival pallor. PERRL. EOMI  ENT   Head:   Normocephalic and atraumatic.   Nose:   No congestion/rhinnorhea. No septal hematoma   Mouth/Throat:   MMM, no pharyngeal erythema. No peritonsillar mass. No uvula shift.   Neck:   No stridor. No SubQ emphysema. No meningismus. Hematological/Lymphatic/Immunilogical:   No cervical lymphadenopathy. Cardiovascular:   RRR. Normal and symmetric distal pulses are present in all extremities. No murmurs, rubs, or gallops. Respiratory:   Normal respiratory effort without tachypnea nor retractions. Breath sounds are clear and equal bilaterally. No wheezes/rales/rhonchi. Gastrointestinal:   Soft and nontender. No distention. There is no CVA tenderness.  No rebound, rigidity, or guarding. Genitourinary:   deferred Musculoskeletal:   Nontender with normal range of motion in all  extremities. No joint effusions.  No lower extremity tenderness.  No edema. Neurologic:   Normal speech and language.  CN 2-10 normal. Diminish strength in the left upper extremity and left lower extremity. Difficulty raising extremities against gravity. Positive left-sided pronator drift. Difficulty with left-sided finger to nose. With ambulation, patient is able to walk and maintain balance but she frequently has to restudy herself and appears to have difficulty placing her left foot on the ground to start astride. She reports that it's hard to tell where her foot is in space.  Skin:    Skin is warm, dry and intact. No rash noted.  No petechiae, purpura, or bullae. Psychiatric:   Mood and affect are normal. Speech and behavior are normal. Patient exhibits appropriate insight and judgment.  ____________________________________________    LABS (pertinent positives/negatives) (all labs ordered are listed, but only abnormal results are displayed) Labs Reviewed  COMPREHENSIVE METABOLIC PANEL - Abnormal; Notable for the following:    Glucose, Bld 102 (*)    All other components within normal limits  CBC WITH DIFFERENTIAL/PLATELET  CK  URINALYSIS COMPLETEWITH MICROSCOPIC ([facility] ONLY)   ____________________________________________   EKG  Interpreted by me Sinus rhythm rate of 90, normal axis and intervals. Poor R-wave progression in anterior precordial leads. Incomplete right bundle branch block. Normal ST segments and T waves  ____________________________________________    RADIOLOGY  Chest x-ray unremarkable CT angiogram head and neck unremarkable  ____________________________________________   PROCEDURES   ____________________________________________   INITIAL IMPRESSION / ASSESSMENT AND PLAN / ED COURSE  Pertinent labs & imaging results that were available during my care of the patient were reviewed by me and considered in my medical decision making (see chart for  details).  Patient presents with left-sided weakness of uncertain duration but appears to be 2-3 days. Never had anything like this before and no serious risk factors that we know of for stroke. However, this appears to be an ischemic stroke despite the negative workup. Aspirin given we'll admit for further evaluation. Case discussed with the hospitalist at 2:30 PM for evaluation.     ____________________________________________   FINAL CLINICAL IMPRESSION(S) / ED DIAGNOSES  Final diagnoses:  Acute ischemic stroke [facility])      Sharman Cheek, MD 09/12/15 1428

## 2015-09-12 NOTE — ED Notes (Signed)
Pt returned from MRI at this time

## 2015-09-12 NOTE — ED Notes (Addendum)
Patient transported to MRI at this time via MRI tech

## 2015-09-13 ENCOUNTER — Inpatient Hospital Stay
Admit: 2015-09-13 | Discharge: 2015-09-13 | Disposition: A | Discharge status: Home / Self Care | Payer: Self-pay | Attending: Internal Medicine | Admitting: Internal Medicine

## 2015-09-13 ENCOUNTER — Inpatient Hospital Stay: Payer: Self-pay

## 2015-09-13 ENCOUNTER — Inpatient Hospital Stay: Payer: MEDICAID

## 2015-09-13 LAB — LIPID PANEL
CHOLESTEROL: 168 mg/dL (ref 0–200)
HDL: 72 mg/dL (ref >40)
LDL Cholesterol: 86 mg/dL (ref 0–99)
TRIGLYCERIDES: 51 mg/dL (ref <150)
Total CHOL/HDL Ratio: 2.3 RATIO
VLDL: 10 mg/dL (ref 0–40)

## 2015-09-13 LAB — HEMOGLOBIN A1C: HEMOGLOBIN A1C: 5.9 % (ref 4.0–6.0)

## 2015-09-13 MED ORDER — HYDROCODONE-ACETAMINOPHEN 5-325 MG PO TABS
1.0000 | ORAL_TABLET | Freq: Four times a day (QID) | ORAL | Status: DC | PRN
  Administered 2015-09-13 (×2): 1 via ORAL
  Filled 2015-09-13 (×2): qty 1

## 2015-09-13 NOTE — Clinical Social Work Note (Signed)
Clinical Social Worker consulted for "other". CSW spoke with RNCM, no current CSW needs identified. CSW will sign off at this time. Please reconsult if a need arises prior to discharge.  Dede Query, MSW, LCSW Clinical Social Worker  [phone]

## 2015-09-13 NOTE — Progress Notes (Signed)
PT Cancellation Note  Patient Details Name: Rufus Cypert MRN: [id number] DOB: [birth year]   Cancelled Treatment:    Reason Eval/Treat Not Completed: Patient at procedure or test/unavailable (Pt currently off floor and not available for PT session.)  Will re-attempt PT eval at a later date/time.   Hendricks Limes 09/13/2015, 2:35 PM Hendricks Limes, PT [phone]

## 2015-09-13 NOTE — Plan of Care (Signed)
Problem: Education: Goal: Knowledge of disease or condition will improve Outcome: Progressing Pt is alert and oriented, c/o left sided headache. Ibuprofen given with improvement. NIH of 3, c/o decreased sensation on the left side. Weakness on the left side.  Family at bedside, supportive. Moderate Fall Risk, encouraged to call for assistance.

## 2015-09-13 NOTE — Progress Notes (Signed)
[facility] - [place] [facility]   PATIENT NAME: Vicki Ellis    MR#:  [id number]  DATE OF BIRTH:  [birth year]  SUBJECTIVE: Still feels weak on the left side and also her headache. Slurred speech. No weakness in the right side. MRI of the brain negative for stroke.   CHIEF COMPLAINT:   Chief Complaint  Patient presents with  . Weakness    REVIEW OF SYSTEMS:   ROS CONSTITUTIONAL: No fever, fatigue or weakness.  EYES: No blurred or double vision.  EARS, NOSE, AND THROAT: No tinnitus or ear pain.  RESPIRATORY: No cough, shortness of breath, wheezing or hemoptysis.  CARDIOVASCULAR: No chest pain, orthopnea, edema.  GASTROINTESTINAL: No nausea, vomiting, diarrhea or abdominal pain.  GENITOURINARY: No dysuria, hematuria.  ENDOCRINE: No polyuria, nocturia,  HEMATOLOGY: No anemia, easy bruising or bleeding SKIN: No rash or lesion. MUSCULOSKELETAL: No joint pain or arthritis.   NEUROLOGIC: No tingling, numbness, weakness.  PSYCHIATRY: No anxiety or depression.   DRUG ALLERGIES:   Allergies  Allergen Reactions  . Oxycodone-Acetaminophen Other (See Comments)    Reaction:  Tachycardia  . Propoxyphene Other (See Comments)    Reaction:  Tachycardia  . Acyclovir And Related Other (See Comments)    Reaction:  Unknown   . Iohexol Hives    Pt developed hives following CT Angio 09-12-15  . Shellfish Allergy Hives  . Tramadol Rash    VITALS:  Blood pressure 117/62, pulse 70, temperature 98.9 F (37.2 C), temperature source Oral, resp. rate 16, height 5\' 2"  (1.575 m), weight 67.495 kg (148 lb 12.8 oz), last menstrual period 05/22/2015, SpO2 100 %.  PHYSICAL EXAMINATION:  GENERAL:  55 y.o.-year-old patient lying in the bed with no acute distress.  EYES: Pupils equal, round, reactive to light and accommodation. No scleral icterus. Extraocular muscles intact.  HEENT: Head atraumatic, normocephalic. Oropharynx and nasopharynx clear.  NECK:  Supple, no jugular  venous distention. No thyroid enlargement, no tenderness.  LUNGS: Normal breath sounds bilaterally, no wheezing, rales,rhonchi or crepitation. No use of accessory muscles of respiration.  CARDIOVASCULAR: S1, S2 normal. No murmurs, rubs, or gallops.  ABDOMEN: Soft, nontender, nondistended. Bowel sounds present. No organomegaly or mass.  EXTREMITIES: No pedal edema, cyanosis, or clubbing.  NEUROLOGIC: Cranial nerves II through XII are intact. Muscle strength 5/5 in all extremities. Sensation intact. Gait not checked. Noted to have slight weakness on the left side. PSYCHIATRIC: The patient is alert and oriented x 3.  SKIN: No obvious rash, lesion, or ulcer.    LABORATORY PANEL:   CBC  Recent Labs Lab 09/12/15 1240  WBC 6.6  HGB 13.6  HCT 41.7  PLT 222   ------------------------------------------------------------------------------------------------------------------  Chemistries   Recent Labs Lab 09/12/15 1240  NA 140  K 3.8  CL 104  CO2 27  GLUCOSE 102*  BUN 10  CREATININE 0.64  CALCIUM 9.4  AST 21  ALT 18  ALKPHOS 106  BILITOT 0.7   ------------------------------------------------------------------------------------------------------------------  Cardiac Enzymes No results for input(s): TROPONINI in the last 168 hours. ------------------------------------------------------------------------------------------------------------------  RADIOLOGY:  Ct Angio Head W/cm &/or Wo Cm  09/12/2015  CLINICAL DATA:  55 year old female with left side weakness for 1.5 weeks. Dizziness. Headache and left hand numbness. Initial encounter. EXAM: CT ANGIOGRAPHY HEAD AND NECK TECHNIQUE: Multidetector CT imaging of the head and neck was performed using the standard protocol during bolus administration of intravenous contrast. Multiplanar CT image reconstructions and MIPs were obtained to evaluate the vascular anatomy. Carotid stenosis measurements (when  applicable) are obtained utilizing  NASCET criteria, using the distal internal carotid diameter as the denominator. CONTRAST:  OMNIPAQUE IOHEXOL 350 MG/ML SOLN COMPARISON:  Head CT without contrast 10/09/2008. FINDINGS: CT HEAD Brain: Cerebral volume is normal. No ventriculomegaly. No midline shift, mass effect, or evidence of intracranial mass lesion. No acute intracranial hemorrhage identified. No cortically based acute infarct identified. Calvarium and skull base:  No acute osseous abnormality identified. Paranasal sinuses: Trace right mastoid mucosal thickening or fluid appears not significantly changed. Other Visualized paranasal sinuses and mastoids are clear. Orbits: Visualized orbits and scalp soft tissues are within normal limits. CTA NECK Skeleton: Poor posterior dentition right maxillary and left mandible. Otherwise No acute osseous abnormality identified. Other neck: Negative lung apices. No superior mediastinal lymphadenopathy. Thyroid, larynx, pharynx, parapharyngeal spaces, retropharyngeal space, sublingual space, submandibular glands, and parotid glands are within normal limits. No cervical lymphadenopathy. Aortic arch: 4 vessel arch configuration, aberrant origin of the right subclavian artery. No arch atherosclerosis. Right carotid system: Negative. Left carotid system: Negative. 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 Normal right vertebral artery origin. Negative cervical right vertebral artery. No proximal left subclavian artery stenosis. Normal left vertebral artery origin. Fairly codominant vertebral arteries. Negative cervical left vertebral artery. CTA HEAD Posterior circulation: Codominant, negative distal vertebral arteries. Normal PICA origins. Normal vertebrobasilar junction. No basilar artery stenosis. Normal SCA and right PCA origins. Fetal type left PCA origin. Right posterior communicating artery diminutive or absent. Bilateral PCA branches within normal limits. Anterior  circulation: Both ICA siphons are patent without stenosis. Minimal cavernous calcified plaque. Ophthalmic and left posterior communicating artery origins are normal. Patent carotid termini. Normal MCA and ACA origins. Diminutive or absent anterior communicating artery. Bilateral ACA branches within normal limits. Left MCA origin, M1 segment, bifurcation, and left MCA branches are within normal limits. Right MCA origin, M1 segment, trifurcation, and right MCA branches are within normal limits. Venous sinuses: Patent. Anatomic variants: Aberrant origin of the right subclavian artery. Fetal type left PCA origin. Delayed phase: No abnormal enhancement identified. IMPRESSION: 1. Negative for age CTA Head and Neck. Minimal atherosclerosis. Normal anatomic variations including aberrant origin of the right subclavian artery. 2.  Normal CT appearance of the brain. 3. Intermittent poor posterior dentition, recommend dental followup. Electronically Signed   By: Odessa Fleming M.D.   On: 09/12/2015 13:51   Dg Chest 2 View  09/12/2015  CLINICAL DATA:  Increasing weakness on the left side of the body. Dizziness. Shortness of breath. Blurred vision. EXAM: CHEST  2 VIEW COMPARISON:  08/31/2015 FINDINGS: Mild enlargement of the cardiopericardial silhouette without edema. The lungs appear clear. No pleural effusion. Right upper quadrant clips noted. No significant bony abnormality is identified. IMPRESSION: 1. Mild enlargement of the cardiopericardial silhouette, without edema. Electronically Signed   By: Gaylyn Rong M.D.   On: 09/12/2015 13:16   Ct Angio Neck W/cm &/or Wo/cm  09/12/2015  CLINICAL DATA:  55 year old female with left side weakness for 1.5 weeks. Dizziness. Headache and left hand numbness. Initial encounter. EXAM: CT ANGIOGRAPHY HEAD AND NECK TECHNIQUE: Multidetector CT imaging of the head and neck was performed using the standard protocol during bolus administration of intravenous contrast. Multiplanar CT image  reconstructions and MIPs were obtained to evaluate the vascular anatomy. Carotid stenosis measurements (when applicable) are obtained utilizing NASCET criteria, using the distal internal carotid diameter as the denominator. CONTRAST:  OMNIPAQUE IOHEXOL 350 MG/ML SOLN COMPARISON:  Head CT without contrast 10/09/2008. FINDINGS: CT HEAD Brain: Cerebral  volume is normal. No ventriculomegaly. No midline shift, mass effect, or evidence of intracranial mass lesion. No acute intracranial hemorrhage identified. No cortically based acute infarct identified. Calvarium and skull base:  No acute osseous abnormality identified. Paranasal sinuses: Trace right mastoid mucosal thickening or fluid appears not significantly changed. Other Visualized paranasal sinuses and mastoids are clear. Orbits: Visualized orbits and scalp soft tissues are within normal limits. CTA NECK Skeleton: Poor posterior dentition right maxillary and left mandible. Otherwise No acute osseous abnormality identified. Other neck: Negative lung apices. No superior mediastinal lymphadenopathy. Thyroid, larynx, pharynx, parapharyngeal spaces, retropharyngeal space, sublingual space, submandibular glands, and parotid glands are within normal limits. No cervical lymphadenopathy. Aortic arch: 4 vessel arch configuration, aberrant origin of the right subclavian artery. No arch atherosclerosis. Right carotid system: Negative. Left carotid system: Negative. 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 Normal right vertebral artery origin. Negative cervical right vertebral artery. No proximal left subclavian artery stenosis. Normal left vertebral artery origin. Fairly codominant vertebral arteries. Negative cervical left vertebral artery. CTA HEAD Posterior circulation: Codominant, negative distal vertebral arteries. Normal PICA origins. Normal vertebrobasilar junction. No basilar artery stenosis. Normal SCA and right PCA origins.  Fetal type left PCA origin. Right posterior communicating artery diminutive or absent. Bilateral PCA branches within normal limits. Anterior circulation: Both ICA siphons are patent without stenosis. Minimal cavernous calcified plaque. Ophthalmic and left posterior communicating artery origins are normal. Patent carotid termini. Normal MCA and ACA origins. Diminutive or absent anterior communicating artery. Bilateral ACA branches within normal limits. Left MCA origin, M1 segment, bifurcation, and left MCA branches are within normal limits. Right MCA origin, M1 segment, trifurcation, and right MCA branches are within normal limits. Venous sinuses: Patent. Anatomic variants: Aberrant origin of the right subclavian artery. Fetal type left PCA origin. Delayed phase: No abnormal enhancement identified. IMPRESSION: 1. Negative for age CTA Head and Neck. Minimal atherosclerosis. Normal anatomic variations including aberrant origin of the right subclavian artery. 2.  Normal CT appearance of the brain. 3. Intermittent poor posterior dentition, recommend dental followup. Electronically Signed   By: Odessa Fleming M.D.   On: 09/12/2015 13:51   Mr Brain Wo Contrast  09/12/2015  CLINICAL DATA:  55 year old female with left side weakness for 1.5 weeks. Dizziness and headache. Initial encounter. EXAM: MRI HEAD WITHOUT CONTRAST TECHNIQUE: Multiplanar, multiecho pulse sequences of the brain and surrounding structures were obtained without intravenous contrast. COMPARISON:  CTA head and neck 1318 hours today. Head CT 10/09/2008. FINDINGS: Cerebral volume is normal. No restricted diffusion to suggest acute infarction. No midline shift, mass effect, evidence of mass lesion, ventriculomegaly, extra-axial collection or acute intracranial hemorrhage. Cervicomedullary junction and pituitary are within normal limits. Negative visualized cervical spine. Major intracranial vascular flow voids are within normal limits. Wallace Cullens and white matter signal  is within normal limits for age throughout the brain. Grossly normal visualized internal auditory structures. Small mucous retention cyst left maxillary sinus. Otherwise paranasal sinuses and mastoids are clear. Negative orbit and scalp soft tissues. IMPRESSION: Normal for age noncontrast MRI appearance of the brain. Electronically Signed   By: Odessa Fleming M.D.   On: 09/12/2015 16:09    EKG:   Orders placed or performed in visit on 03/07/14  . EKG 12-Lead    ASSESSMENT AND PLAN:   Possible left-sided weakness due to migraine: MRI of the brain is negative for acute stroke. Waiting for ultrasound of carotids, echocardiogram. If they are negative patient should be discharged home. I explained that the  MRI of the brain is negative for stroke. Usually sometimes migraine can have a symptoms of hemiplegia.     All the records are reviewed and case discussed with Care Management/Social Workerr. Management plans discussed with the patient, family and they are in agreement.  CODE STATUS: Full  TOTAL TIME TAKING CARE OF THIS PATIENT: 35 minutes.   POSSIBLE D/C IN 1DAYS, DEPENDING ON CLINICAL CONDITION.   Katha Hamming M.D on 09/13/2015 at 1:05 PM  Between 7am to 6pm - Pager - [phone]  After 6pm go to [email or web address] - password [email or web address] [facility]  [facility]  Office  [phone]  CC: Primary care physician; Sandrea Hughs, NP   Note: This dictation was prepared with Dragon dictation along with smaller phrase technology. Any transcriptional errors that result from this process are unintentional.

## 2015-09-13 NOTE — Progress Notes (Signed)
   09/12/15 1830  Clinical Encounter Type  Visited With Patient and family together  Visit Type Initial  Referral From Nurse  Consult/Referral To Chaplain  Spiritual Encounters  Spiritual Needs Literature  Stress Factors  Patient Stress Factors Exhausted;Health changes  Family Stress Factors Major life changes  Met w/patient & family. Provided AD education and left material with family. Advised they should notify the staff to contact chaplain when the patient is ready to proceed. At that time we will gather notary and witnesses. Chap. Jett Fukuda G. [place]

## 2015-09-13 NOTE — Progress Notes (Signed)
Spoke with Dr. Luberta Mutter about q 4hr neuro checks and vitals as well as NIH scale.  Pt's MRI was negative for stroke.  Per Dr. Luberta Mutter no need for neuro checks and pt can have routine vitals.  Will continue to monitor pt.  Orson Ape, RN

## 2015-09-13 NOTE — Progress Notes (Signed)
*  PRELIMINARY RESULTS* Echocardiogram 2D Echocardiogram has been performed.  Vicki Ellis Hege 09/13/2015, 2:16 PM

## 2015-09-13 NOTE — Evaluation (Addendum)
Occupational Therapy Evaluation Patient Details Name: Vicki Ellis MRN: [id number] DOB: 1960/11/04 Today's Date: 09/13/2015    History of Present Illness This patient is a 55 year old female who came to [facility] with complains of left-sided weakness of the past 2 or 3 days. She also feels like she gets tired very easily. Pains of left-sided headache as well. This all started about 10 days ago when she started having pain in the left side of the body. This then progressed to some paresthesias, and then has worsened over the last few days to weakness.  MRI is negative.    Clinical Impression   This patient is a 55 year old female with the above history. She lives in a one story home with her daughter and had been independent with activities of daily living and functional mobility. She now shows deficits in sensation, mobility, and L UE function. She would benefit from Occupational Therapy for ADL/functional mobility training.    Follow Up Recommendations       Equipment Recommendations       Recommendations for Other Services       Precautions / Restrictions        Mobility Bed Mobility Overal bed mobility: Independent                Transfers Overall transfer level:  (Contact guard, gait a bit tentative.)                    Balance                                            ADL                                         General ADL Comments: Had been independent with ADL. She now could dress and use toilet herself with set up and supervision.      Vision     Perception     Praxis      Pertinent Vitals/Pain Pain Assessment:  (Headache, ibuprophin given)     Hand Dominance Right   Extremity/Trunk Assessment Upper Extremity Assessment Upper Extremity Assessment:  (B UE range WNL R 4+/5 through out grip 40 lbs sensation normal - L UE 3/5 through out (inconsistant) grip 15 lbs sensation  diminished to light touch and sharp, normal for temp and stereognosis. 9 hole peg R 25 seconds L 35 seconds.)   Lower Extremity Assessment Lower Extremity Assessment: Defer to PT evaluation       Communication Communication Communication: No difficulties   Cognition Arousal/Alertness: Awake/alert   Overall Cognitive Status: Within Functional Limits for tasks assessed                     General Comments       Exercises       Shoulder Instructions      Home Living Family/patient expects to be discharged to:: Private residence Living Arrangements: Children (Daughter) Available Help at Discharge: Family Type of Home: House Home Access: Stairs to enter Technical brewer of Steps: 5 with rail   Home Layout: One level     Bathroom Shower/Tub: Tub/shower unit  Prior Functioning/Environment Level of Independence: Independent             OT Diagnosis: Paresis   OT Problem List: Decreased strength;Decreased activity tolerance;Decreased coordination;Impaired sensation;Decreased knowledge of use of DME or AE;Impaired UE functional use   OT Treatment/Interventions: Self-care/ADL training;Therapeutic exercise;Neuromuscular education;DME and/or AE instruction    OT Goals(Current goals can be found in the care plan section) Acute Rehab OT Goals Patient Stated Goal: to get better OT Goal Formulation: With patient/family Time For Goal Achievement: 09/27/15 Potential to Achieve Goals: Good  OT Frequency: Min 1X/week   Barriers to D/C:            Co-evaluation              End of Session Equipment Utilized During Treatment: Gait belt (stroke test kit)  Activity Tolerance:   Patient left: in bed;with call bell/phone within reach;with bed alarm set;with family/visitor present   Time: 0945-1005 OT Time Calculation (min): 20 min Charges:  OT General Charges $OT Visit: 1 Procedure OT Evaluation $OT Eval Low Complexity: 1  Procedure G-Codes:    Myrene Galas, MS/OTR/L   09/13/2015, 10:19 AM

## 2015-09-13 NOTE — Progress Notes (Signed)
Speech Therapy Note: Received order; reviewed chart notes; consulted NSG re: pt's status. Met w/ pt and family in room. Explained SLP role and asked about swallowing and speech-language abilities. Pt and family had no concerns re: dysphagia or speech-language abilities. Pt conversed w/ SLP appropriately and denied any overt s/s of aspiration when eating/drinking; family agreed. Pt agreed she is at her baseline w/ re: to [place]; no skilled services indicated at this time. NSG to reconsult if any change in status. Pt/family agreed. NSG updated.

## 2015-09-14 LAB — LIPID PANEL
CHOL/HDL RATIO: 2.3 ratio
CHOLESTEROL: 173 mg/dL (ref 0–200)
HDL: 75 mg/dL (ref >40)
LDL Cholesterol: 77 mg/dL (ref 0–99)
TRIGLYCERIDES: 104 mg/dL (ref <150)
VLDL: 21 mg/dL (ref 0–40)

## 2015-09-14 NOTE — Progress Notes (Signed)
PT Cancellation Note  Patient Details Name: Vicki Ellis MRN: [id number] DOB: 1960/09/03   Cancelled Treatment:    Reason Eval/Treat Not Completed: Patient declined, no reason specified.  Upon PT entering room, pt was standing in room with street clothes on and reporting already getting discharged from hospital.  Pt refusing PT session.  PT educated pt on purpose of PT consult and also for assessment for any potential DME or PT service needs upon discharge but pt continued to refuse and stated "maybe later".  PT noted to pt that she was already dressed to go home and may not be around later to attempt PT eval and pt continued to decline PT session/eval.  CM notified.   Irving Burton Tabia Landowski 09/14/2015, 10:46 AM Hendricks Limes, PT [phone]

## 2015-09-14 NOTE — Discharge Summary (Signed)
Vicki Ellis, is a 55 y.o. female  DOB 07-22-1961  MRN [id number].  Admission date:  09/12/2015  Admitting Physician  Auburn Bilberry, MD  Discharge Date:  09/14/2015   Primary MD  Sandrea Hughs, NP  Recommendations for primary care physician for things to follow:  FOLLOW UP WITH PMD IN ONE WEEK   Admission Diagnosis  CVA (cerebral infarction) [I63.9] Acute ischemic stroke [facility]) [I63.9]   Discharge Diagnosis  CVA (cerebral infarction) [I63.9] Acute ischemic stroke [facility]) [I63.9]    Active Problems:   Left-sided weakness      Past Medical History  Diagnosis Date  . Asthma     Past Surgical History  Procedure Laterality Date  . Cesarean section    . Cholecystectomy         History of present illness and  Hospital Course:     Kindly see H&P for history of present illness and admission details, please review complete Labs, Consult reports and Test reports for all details in brief  HPI  from the history and physical done on the day of admission 55 yr old female with h/o asthma ,admitted for left sided  Weakness/ for 2 weeks, intial CT head is negative for stroke,she is admitted for stroke unit for TIA?CVA>    Hospital Course   1.LEft sided weakness ;PT CVA work up  Is negaitve  MRI  Of brain did not show any stroke.WCho EF more than 55%>EKG DID NOT show any arrythmia.PT did not recommend any PT.pt has migraine head aches,thought to have migraine headache with transient left side weakness.she says she gets weakness on off for long time,so I told her she can follow  Up with PMD for further work up for deficiencies like B!2,D<and also MS?we gave 10 tablets of Narco 5/325 mg prescription  2.anxiety;continue trazodone , Discharge Condition: stable.   Follow UP;;   follow up with pmd in [facility]   Discharge  Instructions  and  Discharge Medications        Medication List    TAKE these medications        aspirin EC 81 MG tablet  Take 162 mg by mouth daily as needed (for chest pain).     naproxen 500 MG tablet  Commonly known as:  NAPROSYN  Take 1 tablet (500 mg total) by mouth 2 (two) times daily with a meal.     traZODone 100 MG tablet  Commonly known as:  DESYREL  Take 100 mg by mouth at bedtime.          Diet and Activity recommendation: See Discharge Instructions above   Consults obtained -none   Major procedures and Radiology Reports - PLEASE review detailed and final reports for all details, in brief -      Ct Angio Head W/cm &/or Wo Cm  09/12/2015  CLINICAL DATA:  55 year old female with left side weakness for 1.5 weeks. Dizziness. Headache and left hand numbness. Initial encounter. EXAM: CT ANGIOGRAPHY HEAD AND NECK TECHNIQUE: Multidetector CT imaging of the head and neck was performed using the standard protocol during bolus administration of intravenous contrast. Multiplanar CT image reconstructions and MIPs were obtained to evaluate the vascular anatomy. Carotid stenosis measurements (when applicable) are obtained utilizing NASCET criteria, using the distal internal carotid diameter as the denominator. CONTRAST:  OMNIPAQUE IOHEXOL 350 MG/ML SOLN COMPARISON:  Head CT without contrast 10/09/2008. FINDINGS: CT HEAD Brain: Cerebral volume is normal. No ventriculomegaly. No midline shift, mass effect, or evidence of intracranial mass  lesion. No acute intracranial hemorrhage identified. No cortically based acute infarct identified. Calvarium and skull base:  No acute osseous abnormality identified. Paranasal sinuses: Trace right mastoid mucosal thickening or fluid appears not significantly changed. Other Visualized paranasal sinuses and mastoids are clear. Orbits: Visualized orbits and scalp soft tissues are within normal limits. CTA NECK Skeleton: Poor posterior dentition  right maxillary and left mandible. Otherwise No acute osseous abnormality identified. Other neck: Negative lung apices. No superior mediastinal lymphadenopathy. Thyroid, larynx, pharynx, parapharyngeal spaces, retropharyngeal space, sublingual space, submandibular glands, and parotid glands are within normal limits. No cervical lymphadenopathy. Aortic arch: 4 vessel arch configuration, aberrant origin of the right subclavian artery. No arch atherosclerosis. Right carotid system: Negative. Left carotid system: Negative. 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 Normal right vertebral artery origin. Negative cervical right vertebral artery. No proximal left subclavian artery stenosis. Normal left vertebral artery origin. Fairly codominant vertebral arteries. Negative cervical left vertebral artery. CTA HEAD Posterior circulation: Codominant, negative distal vertebral arteries. Normal PICA origins. Normal vertebrobasilar junction. No basilar artery stenosis. Normal SCA and right PCA origins. Fetal type left PCA origin. Right posterior communicating artery diminutive or absent. Bilateral PCA branches within normal limits. Anterior circulation: Both ICA siphons are patent without stenosis. Minimal cavernous calcified plaque. Ophthalmic and left posterior communicating artery origins are normal. Patent carotid termini. Normal MCA and ACA origins. Diminutive or absent anterior communicating artery. Bilateral ACA branches within normal limits. Left MCA origin, M1 segment, bifurcation, and left MCA branches are within normal limits. Right MCA origin, M1 segment, trifurcation, and right MCA branches are within normal limits. Venous sinuses: Patent. Anatomic variants: Aberrant origin of the right subclavian artery. Fetal type left PCA origin. Delayed phase: No abnormal enhancement identified. IMPRESSION: 1. Negative for age CTA Head and Neck. Minimal atherosclerosis. Normal anatomic  variations including aberrant origin of the right subclavian artery. 2.  Normal CT appearance of the brain. 3. Intermittent poor posterior dentition, recommend dental followup. Electronically Signed   By: Odessa Fleming M.D.   On: 09/12/2015 13:51   Dg Chest 2 View  09/12/2015  CLINICAL DATA:  Increasing weakness on the left side of the body. Dizziness. Shortness of breath. Blurred vision. EXAM: CHEST  2 VIEW COMPARISON:  08/31/2015 FINDINGS: Mild enlargement of the cardiopericardial silhouette without edema. The lungs appear clear. No pleural effusion. Right upper quadrant clips noted. No significant bony abnormality is identified. IMPRESSION: 1. Mild enlargement of the cardiopericardial silhouette, without edema. Electronically Signed   By: Gaylyn Rong M.D.   On: 09/12/2015 13:16   Dg Chest 2 View  09/01/2015  CLINICAL DATA:  55 year old female with cough and left rib pain EXAM: CHEST  2 VIEW COMPARISON:  Radiograph dated 03/07/2014 FINDINGS: The heart size and mediastinal contours are within normal limits. Both lungs are clear. The visualized skeletal structures are unremarkable. IMPRESSION: No active cardiopulmonary disease. Electronically Signed   By: Elgie Collard M.D.   On: 09/01/2015 00:38   Ct Head Wo Contrast  09/13/2015  CLINICAL DATA:  Left-sided weakness for 3 days. Left-sided headache for 10 days. EXAM: CT HEAD WITHOUT CONTRAST TECHNIQUE: Contiguous axial images were obtained from the base of the skull through the vertex without intravenous contrast. COMPARISON:  10/09/2008 FINDINGS: No evidence of intracranial hemorrhage, brain edema, or other signs of acute infarction. No evidence of intracranial mass lesion or mass effect. No abnormal extraaxial fluid collections identified. Ventricles are normal in size. No skull abnormality identified. IMPRESSION: Negative noncontrast head CT.  Electronically Signed   By: Myles Rosenthal M.D.   On: 09/13/2015 14:27   Ct Angio Neck W/cm &/or  Wo/cm  09/12/2015  CLINICAL DATA:  55 year old female with left side weakness for 1.5 weeks. Dizziness. Headache and left hand numbness. Initial encounter. EXAM: CT ANGIOGRAPHY HEAD AND NECK TECHNIQUE: Multidetector CT imaging of the head and neck was performed using the standard protocol during bolus administration of intravenous contrast. Multiplanar CT image reconstructions and MIPs were obtained to evaluate the vascular anatomy. Carotid stenosis measurements (when applicable) are obtained utilizing NASCET criteria, using the distal internal carotid diameter as the denominator. CONTRAST:  OMNIPAQUE IOHEXOL 350 MG/ML SOLN COMPARISON:  Head CT without contrast 10/09/2008. FINDINGS: CT HEAD Brain: Cerebral volume is normal. No ventriculomegaly. No midline shift, mass effect, or evidence of intracranial mass lesion. No acute intracranial hemorrhage identified. No cortically based acute infarct identified. Calvarium and skull base:  No acute osseous abnormality identified. Paranasal sinuses: Trace right mastoid mucosal thickening or fluid appears not significantly changed. Other Visualized paranasal sinuses and mastoids are clear. Orbits: Visualized orbits and scalp soft tissues are within normal limits. CTA NECK Skeleton: Poor posterior dentition right maxillary and left mandible. Otherwise No acute osseous abnormality identified. Other neck: Negative lung apices. No superior mediastinal lymphadenopathy. Thyroid, larynx, pharynx, parapharyngeal spaces, retropharyngeal space, sublingual space, submandibular glands, and parotid glands are within normal limits. No cervical lymphadenopathy. Aortic arch: 4 vessel arch configuration, aberrant origin of the right subclavian artery. No arch atherosclerosis. Right carotid system: Negative. Left carotid system: Negative. 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 Normal right vertebral artery origin. Negative cervical right vertebral  artery. No proximal left subclavian artery stenosis. Normal left vertebral artery origin. Fairly codominant vertebral arteries. Negative cervical left vertebral artery. CTA HEAD Posterior circulation: Codominant, negative distal vertebral arteries. Normal PICA origins. Normal vertebrobasilar junction. No basilar artery stenosis. Normal SCA and right PCA origins. Fetal type left PCA origin. Right posterior communicating artery diminutive or absent. Bilateral PCA branches within normal limits. Anterior circulation: Both ICA siphons are patent without stenosis. Minimal cavernous calcified plaque. Ophthalmic and left posterior communicating artery origins are normal. Patent carotid termini. Normal MCA and ACA origins. Diminutive or absent anterior communicating artery. Bilateral ACA branches within normal limits. Left MCA origin, M1 segment, bifurcation, and left MCA branches are within normal limits. Right MCA origin, M1 segment, trifurcation, and right MCA branches are within normal limits. Venous sinuses: Patent. Anatomic variants: Aberrant origin of the right subclavian artery. Fetal type left PCA origin. Delayed phase: No abnormal enhancement identified. IMPRESSION: 1. Negative for age CTA Head and Neck. Minimal atherosclerosis. Normal anatomic variations including aberrant origin of the right subclavian artery. 2.  Normal CT appearance of the brain. 3. Intermittent poor posterior dentition, recommend dental followup. Electronically Signed   By: Odessa Fleming M.D.   On: 09/12/2015 13:51   Mr Brain Wo Contrast  09/12/2015  CLINICAL DATA:  55 year old female with left side weakness for 1.5 weeks. Dizziness and headache. Initial encounter. EXAM: MRI HEAD WITHOUT CONTRAST TECHNIQUE: Multiplanar, multiecho pulse sequences of the brain and surrounding structures were obtained without intravenous contrast. COMPARISON:  CTA head and neck 1318 hours today. Head CT 10/09/2008. FINDINGS: Cerebral volume is normal. No restricted  diffusion to suggest acute infarction. No midline shift, mass effect, evidence of mass lesion, ventriculomegaly, extra-axial collection or acute intracranial hemorrhage. Cervicomedullary junction and pituitary are within normal limits. Negative visualized cervical spine. Major intracranial vascular flow voids are within normal  limits. Wallace Cullens and white matter signal is within normal limits for age throughout the brain. Grossly normal visualized internal auditory structures. Small mucous retention cyst left maxillary sinus. Otherwise paranasal sinuses and mastoids are clear. Negative orbit and scalp soft tissues. IMPRESSION: Normal for age noncontrast MRI appearance of the brain. Electronically Signed   By: Odessa Fleming M.D.   On: 09/12/2015 16:09   US Carotid Bilateral  09/13/2015  CLINICAL DATA:  Stroke EXAM: BILATERAL CAROTID DUPLEX ULTRASOUND TECHNIQUE: Wallace Cullens scale imaging, color Doppler and duplex ultrasound were performed of bilateral carotid and vertebral arteries in the neck. COMPARISON:  None. FINDINGS: Criteria: Quantification of carotid stenosis is based on velocity parameters that correlate the residual internal carotid diameter with NASCET-based stenosis levels, using the diameter of the distal internal carotid lumen as the denominator for stenosis measurement. The following velocity measurements were obtained: RIGHT ICA:  78 cm/sec CCA:  77 cm/sec SYSTOLIC ICA/CCA RATIO:  1 DIASTOLIC ICA/CCA RATIO:  1.2 ECA:  88 cm/sec LEFT ICA:  118 cm/sec CCA:  98 cm/sec SYSTOLIC ICA/CCA RATIO:  1.2 DIASTOLIC ICA/CCA RATIO:  1.4 ECA:  59 cm/sec RIGHT CAROTID ARTERY: Minimal plaque in the bulb. Low resistance internal carotid Doppler pattern RIGHT VERTEBRAL ARTERY:  Antegrade. LEFT CAROTID ARTERY: Little if any plaque in the bulb. Low resistance internal carotid Doppler pattern. LEFT VERTEBRAL ARTERY:  Antegrade.  Normal Doppler pattern. IMPRESSION: Less than 50% stenosis in the right and left internal carotid arteries.  Electronically Signed   By: Jolaine Click M.D.   On: 09/13/2015 16:10    Micro Results     No results found for this or any previous visit (from the past 240 hour(s)).     Today   Subjective:   Vicki Ellis today has no headache,no chest abdominal pain,no new weakness tingling or numbness, feels much better wants to go home today.   Objective:   Blood pressure 103/55, pulse 65, temperature 98 F (36.7 C), temperature source Oral, resp. rate 18, height 5\' 2"  (1.575 m), weight 67.495 kg (148 lb 12.8 oz), last menstrual period 05/22/2015, SpO2 99 %.  No intake or output data in the 24 hours ending 09/14/15 2044  Exam Awake Alert, Oriented x 3, No new F.N deficits, Normal affect [place].AT,PERRAL Supple Neck,No JVD, No cervical lymphadenopathy appriciated.  Symmetrical Chest wall movement, Good air movement bilaterally, CTAB RRR,No Gallops,Rubs or new Murmurs, No Parasternal Heave +ve B.Sounds, Abd Soft, Non tender, No organomegaly appriciated, No rebound -guarding or rigidity. No Cyanosis, Clubbing or edema, No new Rash or bruise  Data Review   CBC w Diff:  Lab Results  Component Value Date   WBC 6.6 09/12/2015   WBC 7.0 03/07/2014   HGB 13.6 09/12/2015   HGB 13.2 03/07/2014   HCT 41.7 09/12/2015   HCT 42.0 03/07/2014   PLT 222 09/12/2015   PLT 238 03/07/2014   LYMPHOPCT 36 09/12/2015   MONOPCT 9 09/12/2015   EOSPCT 1 09/12/2015   BASOPCT 1 09/12/2015    CMP:  Lab Results  Component Value Date   NA 140 09/12/2015   NA 139 03/07/2014   K 3.8 09/12/2015   K 3.9 03/07/2014   CL 104 09/12/2015   CL 107 03/07/2014   CO2 27 09/12/2015   CO2 26 03/07/2014   BUN 10 09/12/2015   BUN 11 03/07/2014   CREATININE 0.64 09/12/2015   CREATININE 0.84 03/07/2014   PROT 8.1 09/12/2015   PROT 8.5* 03/07/2014   ALBUMIN 4.3 09/12/2015  ALBUMIN 3.9 03/07/2014   BILITOT 0.7 09/12/2015   BILITOT 0.5 03/07/2014   ALKPHOS 106 09/12/2015   ALKPHOS 103 03/07/2014   AST 21  09/12/2015   AST 33 03/07/2014   ALT 18 09/12/2015   ALT 30 03/07/2014  .   Total Time in preparing paper work, data evaluation and todays exam - 35 minutes  Vicki Ellis M.D on 09/14/2015 at 8:44 PM    Note: This dictation was prepared with Dragon dictation along with smaller phrase technology. Any transcriptional errors that result from this process are unintentional.

## 2015-09-14 NOTE — Progress Notes (Signed)
Pt is being discharged today, discharge instructions reviewed with pt and family, they voiced understanding. IV removed, belongings returned, pt rolled out in wheelchair by staff.

## 2016-06-21 ENCOUNTER — Emergency Department
Admission: EM | Admit: 2016-06-21 | Discharge: 2016-06-21 | Disposition: A | Discharge status: Home / Self Care | Payer: Self-pay | Attending: Emergency Medicine | Admitting: Emergency Medicine

## 2016-06-21 ENCOUNTER — Encounter: Payer: Self-pay | Admitting: Emergency Medicine

## 2016-06-21 ENCOUNTER — Emergency Department: Payer: Self-pay

## 2016-06-21 DIAGNOSIS — R69 Illness, unspecified: Secondary | ICD-10-CM

## 2016-06-21 DIAGNOSIS — J111 Influenza due to unidentified influenza virus with other respiratory manifestations: Secondary | ICD-10-CM | POA: Insufficient documentation

## 2016-06-21 DIAGNOSIS — J45909 Unspecified asthma, uncomplicated: Secondary | ICD-10-CM | POA: Insufficient documentation

## 2016-06-21 DIAGNOSIS — Z791 Long term (current) use of non-steroidal anti-inflammatories (NSAID): Secondary | ICD-10-CM | POA: Insufficient documentation

## 2016-06-21 DIAGNOSIS — Z7982 Long term (current) use of aspirin: Secondary | ICD-10-CM | POA: Insufficient documentation

## 2016-06-21 LAB — INFLUENZA PANEL BY PCR (TYPE A & B)
INFLAPCR: NEGATIVE
Influenza B By PCR: NEGATIVE

## 2016-06-21 MED ORDER — IBUPROFEN 800 MG PO TABS
ORAL_TABLET | ORAL | Status: AC
  Administered 2016-06-21: 800 mg via ORAL
  Filled 2016-06-21: qty 1

## 2016-06-21 MED ORDER — IBUPROFEN 600 MG PO TABS: 600.0000 mg | ORAL_TABLET | Freq: Four times a day (QID) | ORAL | 0 refills | Status: DC | PRN

## 2016-06-21 MED ORDER — IBUPROFEN 800 MG PO TABS
800.0000 mg | ORAL_TABLET | Freq: Once | ORAL | Status: AC
  Administered 2016-06-21: 800 mg via ORAL

## 2016-06-21 NOTE — ED Provider Notes (Signed)
[facility] Emergency Department Provider Note  ____________________________________________  Time seen: Approximately 5:00 PM  I have reviewed the triage vital signs and the nursing notes.   HISTORY  Chief Complaint Fever   HPI Vicki Ellis is a 55 y.o. female who presents to the emergency department for evaluation of fever, body aches, chills, and nausea without vomiting or diarrhea for the past 4 days. She states that her sister had similar symptoms and tested negative for the flu. She has not taken any medications prior to arrival. She has been able to tolerate fluids, but has had a decreased appetite.   Past Medical History:  Diagnosis Date  . Asthma     Patient Active Problem List   Diagnosis Date Noted  . Left-sided weakness 09/12/2015    Past Surgical History:  Procedure Laterality Date  . CESAREAN SECTION    . CHOLECYSTECTOMY      Prior to Admission medications   Medication Sig Start Date End Date Taking? Authorizing Provider  aspirin EC 81 MG tablet Take 162 mg by mouth daily as needed (for chest pain).    Historical Provider, MD  ibuprofen (ADVIL,MOTRIN) 600 MG tablet Take 1 tablet (600 mg total) by mouth every 6 (six) hours as needed. 06/21/16   Chinita Pesterari B Roddy Bellamy, FNP  naproxen (NAPROSYN) 500 MG tablet Take 1 tablet (500 mg total) by mouth 2 (two) times daily with a meal. Patient not taking: Reported on 09/12/2015 09/01/15   Irean HongJade J Sung, MD  traZODone (DESYREL) 100 MG tablet Take 100 mg by mouth at bedtime.    Historical Provider, MD    Allergies Oxycodone-acetaminophen; Propoxyphene; Acyclovir and related; Iohexol; Shellfish allergy; and Tramadol  Family History  Problem Relation Age of Onset  . CAD      Social History Social History  Substance Use Topics  . Smoking status: Never Smoker  . Smokeless tobacco: Never Used  . Alcohol use 0.6 oz/week    1 Standard drinks or equivalent per week    Review of Systems Constitutional:  Positive fever/chills ENT: Negative for sore throat. Cardiovascular: Denies chest pain. Respiratory: Negative for shortness of breath. Negative for cough. Gastrointestinal: Positive for nausea,  no vomiting.  No diarrhea.  Musculoskeletal: Positive for body aches Skin: Negative for rash. Neurological: Positive for headaches ____________________________________________   PHYSICAL EXAM:  VITAL SIGNS: ED Triage Vitals  Enc Vitals Group     BP 06/21/16 1441 125/77     Pulse Rate 06/21/16 1441 (!) 118     Resp 06/21/16 1441 18     Temp 06/21/16 1441 (!) 103.2 F (39.6 C)     Temp Source 06/21/16 1650 Oral     SpO2 06/21/16 1441 99 %     Weight 06/21/16 1442 143 lb (64.9 kg)     Height 06/21/16 1442 5\' 5"  (1.651 m)     Head Circumference --      Peak Flow --      Pain Score 06/21/16 1443 8     Pain Loc --      Pain Edu? --      Excl. in GC? --     Constitutional: Alert and oriented. Acutely ill appearing and in no acute distress. Eyes: Conjunctivae are normal. EOMI. Ears: Bilateral tympanic membranes are normal Nose: No congestion; no rhinnorhea. Mouth/Throat: Mucous membranes are moist.  Oropharynx normal. Tonsils appear without erythema or exudate. Neck: No stridor.  Lymphatic: No cervical lymphadenopathy. Cardiovascular: Normal rate, regular rhythm. Grossly normal heart sounds.  Good peripheral circulation. Respiratory: Normal respiratory effort.  No retractions. Clear to auscultation throughout. Gastrointestinal: Soft and nontender.  Musculoskeletal: FROM x 4 extremities.  Neurologic:  Normal speech and language.  Skin:  Skin is warm, diaphoretic, and intact. No rash noted. Psychiatric: Mood and affect are normal. Speech and behavior are normal.  ____________________________________________   LABS (all labs ordered are listed, but only abnormal results are displayed)  Labs Reviewed  INFLUENZA PANEL BY PCR (TYPE A & B, H1N1)    ____________________________________________  EKG   ____________________________________________  RADIOLOGY  Chest x-ray negative for acute cardiopulmonary abnormality per radiology. ____________________________________________   PROCEDURES  Procedure(s) performed: None  Critical Care performed: No  ____________________________________________   INITIAL IMPRESSION / ASSESSMENT AND PLAN / ED COURSE  Clinical Course    Pertinent labs & imaging results that were available during my care of the patient were reviewed by me and considered in my medical decision making (see chart for details).   Patient was given ibuprofen with successful reduction of fever and decrease in heart rate. Influenza testing is negative, however symptoms started greater than 48 hours ago.  ----------------------------------------- 5:59 PM on 06/21/2016 -----------------------------------------  Symptoms are likely associated with viral illness. Patient is to take ibuprofen every 6 hours as needed for pain or fever. She is to follow-up with her primary care provider for symptoms that are not improving over the next 2-3 days. She was instructed to return to the emergency department for symptoms that change or worsen if she is unable schedule an appointment. ____________________________________________   FINAL CLINICAL IMPRESSION(S) / ED DIAGNOSES  Final diagnoses:  Influenza-like illness    Note:  This document was prepared using Dragon voice recognition software and may include unintentional dictation errors.     Chinita PesterCari B Rohini Jaroszewski, FNP 06/21/16 1759    Sharyn CreamerMark Quale, MD 06/22/16 [phone]

## 2016-06-21 NOTE — ED Triage Notes (Signed)
Reports fever, body aches, chills, nausea

## 2016-10-21 ENCOUNTER — Emergency Department: Payer: Self-pay

## 2016-10-21 ENCOUNTER — Emergency Department
Admission: EM | Admit: 2016-10-21 | Discharge: 2016-10-21 | Disposition: A | Discharge status: Home / Self Care | Payer: Self-pay | Attending: Emergency Medicine | Admitting: Emergency Medicine

## 2016-10-21 DIAGNOSIS — Z7982 Long term (current) use of aspirin: Secondary | ICD-10-CM | POA: Insufficient documentation

## 2016-10-21 DIAGNOSIS — S161XXA Strain of muscle, fascia and tendon at neck level, initial encounter: Secondary | ICD-10-CM | POA: Insufficient documentation

## 2016-10-21 DIAGNOSIS — Z79899 Other long term (current) drug therapy: Secondary | ICD-10-CM | POA: Insufficient documentation

## 2016-10-21 DIAGNOSIS — Y9389 Activity, other specified: Secondary | ICD-10-CM | POA: Insufficient documentation

## 2016-10-21 DIAGNOSIS — Y92410 Unspecified street and highway as the place of occurrence of the external cause: Secondary | ICD-10-CM | POA: Insufficient documentation

## 2016-10-21 DIAGNOSIS — S8002XA Contusion of left knee, initial encounter: Secondary | ICD-10-CM | POA: Insufficient documentation

## 2016-10-21 DIAGNOSIS — M25562 Pain in left knee: Secondary | ICD-10-CM

## 2016-10-21 DIAGNOSIS — S5002XA Contusion of left elbow, initial encounter: Secondary | ICD-10-CM | POA: Insufficient documentation

## 2016-10-21 DIAGNOSIS — Y999 Unspecified external cause status: Secondary | ICD-10-CM | POA: Insufficient documentation

## 2016-10-21 DIAGNOSIS — J45909 Unspecified asthma, uncomplicated: Secondary | ICD-10-CM | POA: Insufficient documentation

## 2016-10-21 MED ORDER — IBUPROFEN 800 MG PO TABS: 800.0000 mg | ORAL_TABLET | Freq: Three times a day (TID) | ORAL | 0 refills | Status: AC | PRN

## 2016-10-21 MED ORDER — ORPHENADRINE CITRATE 30 MG/ML IJ SOLN
60.0000 mg | Freq: Once | INTRAMUSCULAR | Status: AC
  Administered 2016-10-21: 60 mg via INTRAMUSCULAR
  Filled 2016-10-21: qty 2

## 2016-10-21 MED ORDER — CYCLOBENZAPRINE HCL 10 MG PO TABS: 10.0000 mg | ORAL_TABLET | Freq: Three times a day (TID) | ORAL | 0 refills | Status: AC | PRN

## 2016-10-21 MED ORDER — KETOROLAC TROMETHAMINE 30 MG/ML IJ SOLN
60.0000 mg | Freq: Once | INTRAMUSCULAR | Status: AC
  Administered 2016-10-21: 60 mg via INTRAMUSCULAR
  Filled 2016-10-21: qty 2
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
## 2017-03-31 ENCOUNTER — Ambulatory Visit: Payer: Self-pay | Attending: Oncology

## 2017-06-02 ENCOUNTER — Ambulatory Visit
Admission: RE | Admit: 2017-06-02 | Discharge: 2017-06-02 | Disposition: A | Discharge status: Home / Self Care | Payer: Self-pay | Source: Ambulatory Visit | Attending: Oncology | Admitting: Oncology

## 2017-06-02 ENCOUNTER — Ambulatory Visit: Payer: No Typology Code available for payment source | Attending: Oncology

## 2017-06-02 VITALS — BP 119/80 | HR 86 | Temp 98.6°F | Ht 61.0 in | Wt 148.0 lb

## 2017-06-02 DIAGNOSIS — Z0000 Encounter for general adult medical examination without abnormal findings: Secondary | ICD-10-CM

## 2017-06-02 NOTE — Progress Notes (Signed)
Subjective:     Patient ID: Vicki Ellis, female   DOB: Apr 02, 1961, 56 y.o.   MRN: [id number]  HPI   Review of Systems     Objective:   Physical Exam  Pulmonary/Chest: Right breast exhibits no inverted nipple, no mass, no nipple discharge, no skin change and no tenderness. Left breast exhibits no inverted nipple, no mass, no nipple discharge, no skin change and no tenderness. Breasts are symmetrical.  Large pendulous breasts       Assessment:     56 year old hispanic female, fluent in [place], patient presents for [facility] clinic visit.  Delos Haring supported interpretation/translation of medical terminology. Patient screened, and meets [facility] eligibility.  Patient does not have insurance, Medicare or Medicaid.  Handout given on Affordable Care Act.  Instructed patient on breast self-exam using teach back method.  CBE unremarkable.  No mass or lump palpated.  Patient states her sister, who is in her 23's and lives in [place], was recently diagnosed with breast cancer.  Encouraged patient to return annually for screening.    Plan:     Sent for bilateral screening mammogram.

## 2017-06-03 ENCOUNTER — Other Ambulatory Visit: Payer: Self-pay | Admitting: *Deleted

## 2017-06-03 DIAGNOSIS — N6489 Other specified disorders of breast: Secondary | ICD-10-CM

## 2017-06-05 NOTE — Progress Notes (Signed)
 Review of Systems  Constitutional: Negative.   HENT: Negative.   Eyes: Negative.   Respiratory: Negative.   Cardiovascular: Negative.   Gastrointestinal: Negative.   Endocrine: Negative.   Genitourinary: Negative.   Musculoskeletal: Positive for arthralgias.  Skin: Negative.   Allergic/Immunologic: Negative.   Neurological: Negative.   Hematological: Negative.   Psychiatric/Behavioral: Negative.   per pt

## 2017-06-05 NOTE — Progress Notes (Signed)
 [facility] Chief Complaint:   Chief Complaint  Patient presents with  . Trigger Finger    Left Long Finger x 6 months.    History of Present Illness:    Vicki Ellis is a 56 y.o. female that presents to clinic today for evaluation and management of left trigger finger.    Her pain began 6 months ago.  The pain is located in her left long finger.  She describes her pain as dull .  It is aggravated by overuse of her hand.  Her pain can be worse at night after a long day of work. She currently rates pain severity as a 6/10.  She reports associated weakness in her hand, finger locking.  She denies associated numbness and tingling.  She has tried gabapentin without relief.  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  Past Medical, Surgical, Family, Social History, Allergies, Medications:  Current Problem List:does not have a problem list on file.  Past Medical History:  has a past medical history of Asthma, unspecified.   Past Surgical History:  has a past surgical history that includes Cesarean section and Cholecystectomy.  Current Medications: has a current medication list which includes the following prescription(s): gabapentin and zolpidem.  Allergies: is allergic to oxycodone-acetaminophen ; propoxyphene; famciclovir; iohexol ; shellfish containing products; and tramadol.  Social History:  reports that she has never smoked. She has never used smokeless tobacco. She reports that she drinks alcohol. She reports that she does not use drugs.  Family History: family history includes Breast cancer in her sister; Heart disease in her mother.  Review of Systems:   A 10+ ROS was performed, reviewed, and the pertinent orthopaedic findings are documented in the HPI.    Physical Examination:   BP 120/76   Ht 154.9 cm (5' 1)   Wt 68 kg (150 lb)    BMI 28.34 kg/m  General/Constitutional: Well-nourished, well developed, no apparent distress. Psych: Normal mood and affect.  Conversant.  Judgement intact. Neuro: Oriented to person, place and time. Eyes: Pupils equal, round with synchronous movement. No injected sclera. Lymphatic: No palpable adenopathy. Respiratory: Normal respirations, no retractions.  Cardiovascular: No edema or swelling. Musculoskeletal: Left Hand = No erythema, ecchymosis, deformity, or focal swelling.  No locking in the office today.  You can feel an intermittent palpable snap under the 3rd A1 pulley.  TTP over 3rd A1 pulley.  5/5 strength in all fingers but painful resisted 3rd finger flexion.  Able to make a composite fist.  Full wrist ROM.  Positive Tinels.  Positive carpal tunnel compression.  Negative Phalens.  Tests Performed/Reviewed:  None  Assesment:     ICD-10-CM ICD-9-CM  1. Trigger middle finger of left hand M65.332 727.03   SECONDARY CONDITIONS THAT INFLUENCE TREATMENT AND DECISION-MAKING: Non-smoker, overweight  Plan:   I have discussed the nature of her current subjective complaints, clinical examination, test results and have reviewed treatment options.  The plan is to do the following;  - The patient has early left long finger triggering.  The finger does not lock up in the office today.  She also has some test findings which indicate she might have underlying carpal tunnel syndrome too.  I discussed the various treatment options including medications, injections, and ultimately surgical release.  She does not wish to try an injection at this time  but may consider in the future.  - Activity as tolerated. Modify activity as needed according to symptoms. No limitations to weight bearing.  No need for immobilization or bracing. - Continue home exercise program to maintain strength, flexibility, and endurance. - I recommended trying topical over-the-counter creams with lidocaine or aspirin .  I can  also write prescription for Voltaren if needed.  Use Tylenol , anti-inflammatories, relative rest, and ice as needed for pain.   - Follow up as needed if symptoms progress and she would like to consider an injection.  Contact our office with any questions or concerns.  Follow up as indicated, or sooner should any new problems arise, if conditions worsen, or if they are otherwise concerned.    Prentice Reges, DO [facility] [address] [place], [place] [address] Phone: [phone]  This note was generated in part with voice recognition software and I apologize for any typographical errors that were not detected and corrected.

## 2017-06-16 ENCOUNTER — Ambulatory Visit
Admission: RE | Admit: 2017-06-16 | Discharge: 2017-06-16 | Disposition: A | Discharge status: Home / Self Care | Payer: Self-pay | Source: Ambulatory Visit | Attending: Oncology | Admitting: Oncology

## 2017-06-16 DIAGNOSIS — N6489 Other specified disorders of breast: Secondary | ICD-10-CM

## 2017-06-19 NOTE — Progress Notes (Signed)
Letter mailed from [facility] to notify of normal mammogram results.  Patient to return in one year for annual screening.  Copy to [facility]. 

## 2018-06-01 ENCOUNTER — Other Ambulatory Visit: Payer: Self-pay | Admitting: Primary Care

## 2018-06-01 DIAGNOSIS — Z1231 Encounter for screening mammogram for malignant neoplasm of breast: Secondary | ICD-10-CM

## 2019-04-03 ENCOUNTER — Other Ambulatory Visit: Payer: Self-pay

## 2019-04-03 DIAGNOSIS — Z20822 Contact with and (suspected) exposure to covid-19: Secondary | ICD-10-CM

## 2019-04-04 LAB — NOVEL CORONAVIRUS, NAA: SARS-CoV-2, NAA: NOT DETECTED

## 2020-03-29 ENCOUNTER — Other Ambulatory Visit: Payer: Self-pay | Admitting: Primary Care

## 2020-03-29 DIAGNOSIS — Z1231 Encounter for screening mammogram for malignant neoplasm of breast: Secondary | ICD-10-CM

## 2020-04-20 ENCOUNTER — Ambulatory Visit
Admission: RE | Admit: 2020-04-20 | Discharge: 2020-04-20 | Disposition: A | Discharge status: Home / Self Care | Payer: 59 | Source: Ambulatory Visit | Attending: Primary Care | Admitting: Primary Care

## 2020-04-20 ENCOUNTER — Other Ambulatory Visit: Payer: Self-pay

## 2020-04-20 DIAGNOSIS — Z1231 Encounter for screening mammogram for malignant neoplasm of breast: Secondary | ICD-10-CM

## 2021-03-19 ENCOUNTER — Other Ambulatory Visit: Payer: Self-pay | Admitting: Primary Care

## 2021-03-19 DIAGNOSIS — Z1231 Encounter for screening mammogram for malignant neoplasm of breast: Secondary | ICD-10-CM

## 2021-04-24 ENCOUNTER — Ambulatory Visit
Admission: RE | Admit: 2021-04-24 | Discharge: 2021-04-24 | Disposition: A | Discharge status: Home / Self Care | Payer: BC Managed Care – PPO | Source: Ambulatory Visit | Attending: Primary Care | Admitting: Primary Care

## 2021-04-24 ENCOUNTER — Other Ambulatory Visit: Payer: Self-pay

## 2021-04-24 DIAGNOSIS — Z1231 Encounter for screening mammogram for malignant neoplasm of breast: Secondary | ICD-10-CM | POA: Diagnosis present

## 2021-06-28 NOTE — Progress Notes (Signed)
 Established Patient Visit   Chief Complaint: Chief Complaint  Patient presents with  . Chest Pain    Occas with exertion   . Dizziness    Occas   . Arrhythmia    Frequently   . Palpitations    Occas    Date of Service: 06/28/2021 Date of Birth: Aug 02, 1961 PCP: Era Harlene Kung, NP  History of Present Illness: Vicki Ellis is a 60 y.o.female patient  Tachycardia The patient has recent onset of tachycardia associated with racing palpitations a rapid heartbeat, a pounding in the chest occurring over the last 2 months increasing steadily with an ECG showing sinus tachycardia. It is improved and/or relieved by Spontaneously  Holter The holter monitor shows occasional PVCs, sinus tachycardia and asymptomatic bradycardia   and no evidence of inappropriate tachycardia Stress Test   and Regular Stress was performed showing Normal test.  Shortness Of Breath/Dyspnea The patient is having difficulty with chronic dyspnea and or shortness of breath stable over the last 3 months. This occurs with moderate exertion but does not limits ADLs and is also associated with walking and climbing stairs and relieved by rest with a duration that is intermittent (<1 minute). Other related symptoms include exertional chest pressure/discomfort and fatigue. The patient has risk factors of this occurrence including age and postmenopausal female. The source of these symptoms and investigation should include none based on above  Echocardiogram The patient has had an echocardiogram showing Normal LV LVEF > 55% None MR Mild TR without pulmonary hypertension   Results for orders placed or performed in visit on 06/28/21  ECG 12-lead  Result Value Ref Range   Vent Rate (bpm) 113    PR Interval (msec) 152    QRS Interval (msec) 72    QT Interval (msec) 344    QTc (msec) 471        \ Past Medical and Surgical History  Past Medical History Past Medical History:  Diagnosis Date  . Anxiety   . Asthma      Past Surgical History She has a past surgical history that includes Cesarean section and Cholecystectomy.   Medications and Allergies  Current Medications  Current Outpatient Medications on File Prior to Visit  Medication Sig Dispense Refill  . metFORMIN (GLUCOPHAGE-XR) 500 MG XR tablet Take 1 tablet by mouth once daily    . omeprazole (PRILOSEC) 20 MG DR capsule Take 1 capsule by mouth once daily    . zolpidem (AMBIEN) 10 mg tablet Take 1 tablet by mouth at bedtime as needed     No current facility-administered medications on file prior to visit.    Allergies: Oxycodone-acetaminophen , Propoxyphene, Famciclovir, Iodinated contrast media, Iohexol , Shellfish containing products, and Tramadol  Social and Family History  Social History  reports that she has never smoked. She has never used smokeless tobacco. She reports current alcohol use. She reports that she does not use drugs.  Family History Family History  Problem Relation Age of Onset  . Breast cancer Sister   . Heart disease Mother     Review of Systems   Review of Systems  Positive for palps sob  Negative for weight gain weight loss, weakness, vision change, hearing loss, cough, congestion, PND, orthopnea, heartburn, nausea, diaphoresis, vomiting, diarrhea, bloody stool, melena, stomach pain, extremity pain, leg weakness, leg cramping, leg blood clots, headache, blackouts, nosebleed, trouble swallowing, mouth pain, urinary frequency, urination at night, muscle weakness, skin lesions, skin rashes, tingling ,ulcers, numbness, anxiety, and/or depression Physical Examination  Vitals:BP 110/86 (BP Location: Left upper arm, Patient Position: Sitting, BP Cuff Size: Adult)   Pulse (!) 117   Resp 15   Ht 157.5 cm (5' 2)   Wt 64.1 kg (141 lb 6.4 oz)   SpO2 100%   BMI 25.86 kg/m  Ht:157.5 cm (5' 2) Wt:64.1 kg (141 lb 6.4 oz) ADJ:Anib surface area is 1.67 meters squared. Body mass index is 25.86 kg/m. Appearance: well  appearing in no acute distress HEENT: Pupils equally reactive to light and accomodation, no xanthalasma   Neck: Supple, no apparent thyromegaly, masses, or lymphadenopathy  Lungs: normal respiratory effort; no crackles, no rhonchi, no wheezes Heart: Regular rate and rhythm. Normal S1 S2  No gallops, murmur, no rub, PMI is normal size and placement. carotid upstroke normal without bruit. Jugular venous pressure is normal Abdomen: soft, nontender, not distended with normal bowel sounds. No apparent hepatosplenomegally. Abdominal aorta is normal size without bruit Extremities: no edema, no ulcers, no clubbing, no cyanosis Peripheral Pulses: 2+ in upper extremities, 2+ femoral pulses bilaterally, 2+lower extremity  Musculoskeletal;  Normal muscle tone without kyphosis Neurological:   Oriented and Alert, Cranial nerves intact  Assessment   60 y.o. female with  Encounter Diagnoses  Name Primary?  . Palpitations Yes  . SOBOE (shortness of breath on exertion)   . Other chest pain         Plan   -propranolol for heart rate control and/or Prevention of tachycardia -No further cardiac intervention of palpitations which appear to be benign in nature and stable -No further intervention shortness of breath reported above multifactorial in nature including age, decreased exercise tolerance, disability, and/or medication management. The patient is to follow for any worsening symptoms or increase in severity for further in need in investigation or treatment options.   -No further intervention of preventricular contractions which appear to be benign in nature.  Continue diet and exercise as well as treatment of lifestyle actions of which were thoroughly discussed above. We hope that this may continue to reduce concerns and symptoms in the future.  Other consideration of medical management will be used as needed if patient has significant change in symptoms in the future.    Orders Placed This  Encounter  Procedures  . ECG 12-lead    Return in about 4 months (around 10/26/2021).     WOLM GORDY RHYME, MD

## 2022-02-26 ENCOUNTER — Other Ambulatory Visit: Payer: Self-pay | Admitting: Primary Care

## 2022-02-26 DIAGNOSIS — R1011 Right upper quadrant pain: Secondary | ICD-10-CM

## 2022-03-08 ENCOUNTER — Ambulatory Visit
Admission: RE | Admit: 2022-03-08 | Discharge: 2022-03-08 | Disposition: A | Discharge status: Home / Self Care | Payer: BC Managed Care – PPO | Source: Ambulatory Visit | Attending: Primary Care | Admitting: Primary Care

## 2022-03-08 DIAGNOSIS — R1011 Right upper quadrant pain: Secondary | ICD-10-CM | POA: Insufficient documentation

## 2022-03-20 IMAGING — MG MM DIGITAL SCREENING BILAT W/ TOMO AND CAD
8 series · 8 of 24 positions shown · non-contrast
Comparison: Previous exam(s).

CLINICAL DATA: Screening.

EXAM:
DIGITAL SCREENING BILATERAL MAMMOGRAM WITH TOMOSYNTHESIS AND CAD
TECHNIQUE: Bilateral screening digital craniocaudal and mediolateral oblique
mammograms were obtained. Bilateral screening digital breast
tomosynthesis was performed. The images were evaluated with
computer-aided detection.

[L CC synth-2D]
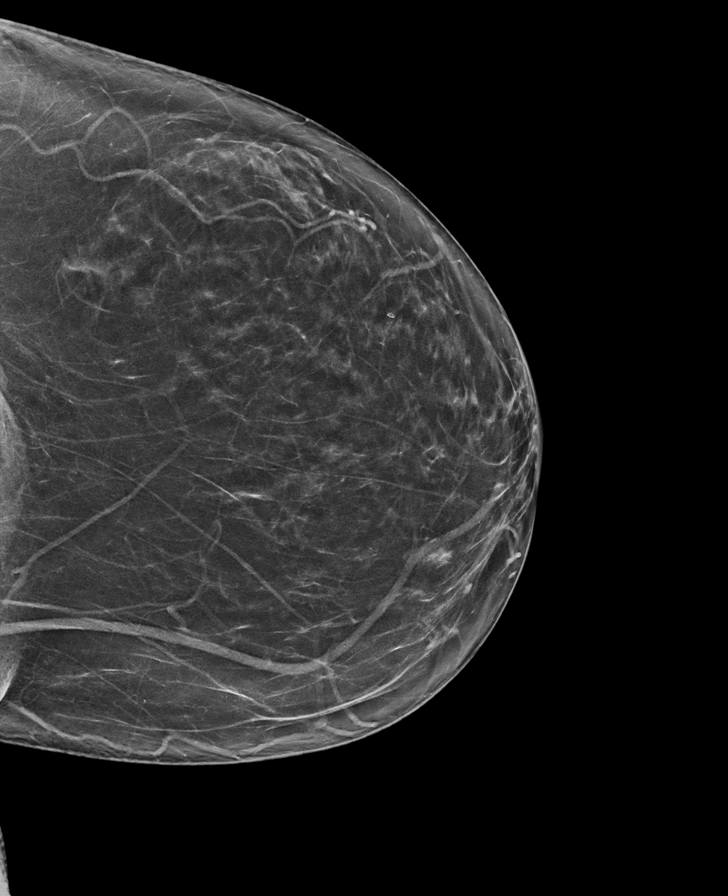

[R MLO synth-2D]
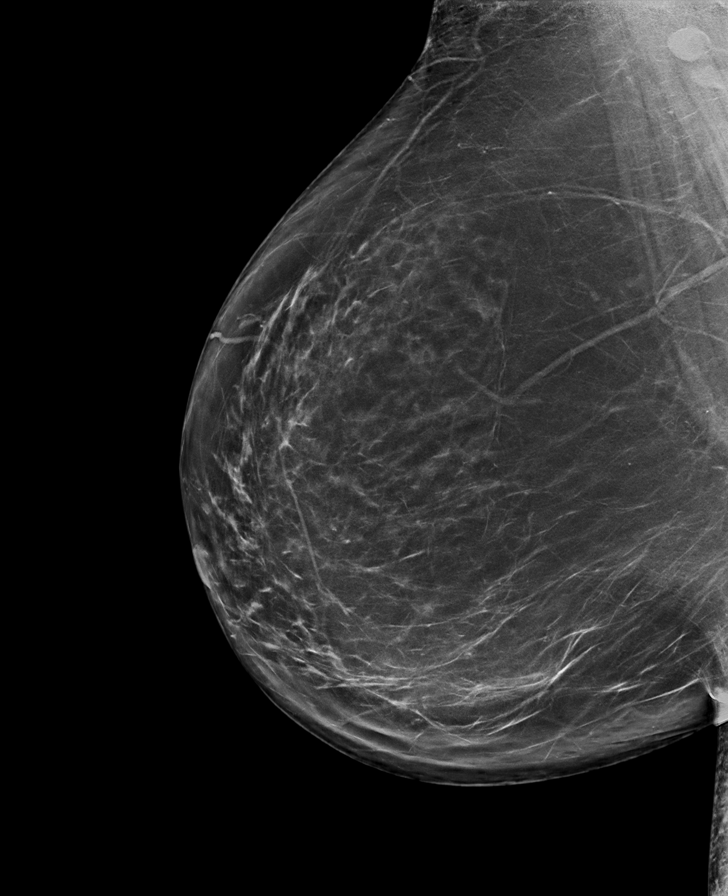

[L MLO synth-2D]
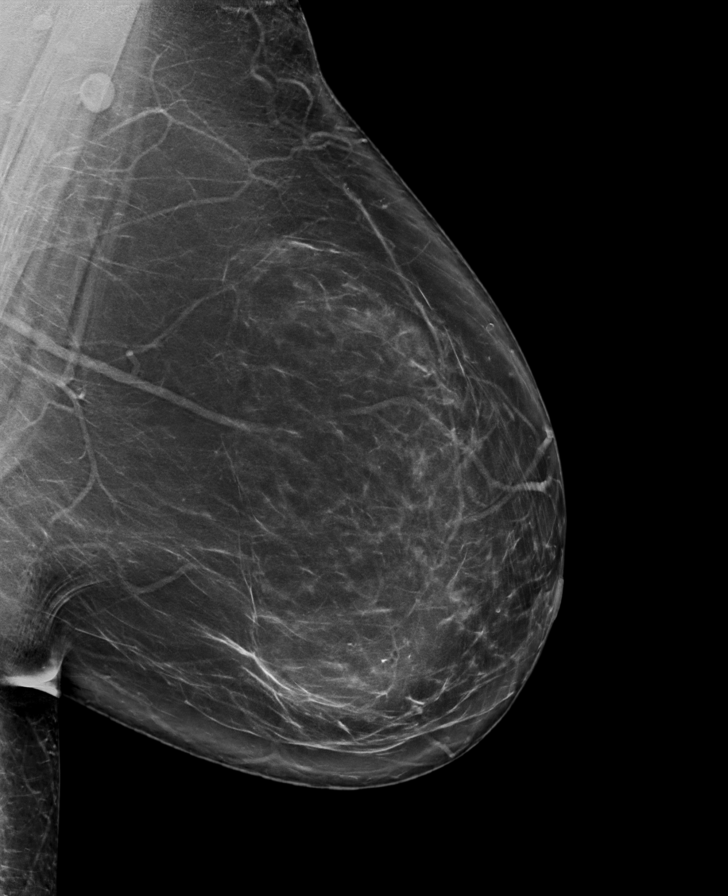

[R CC synth-2D]
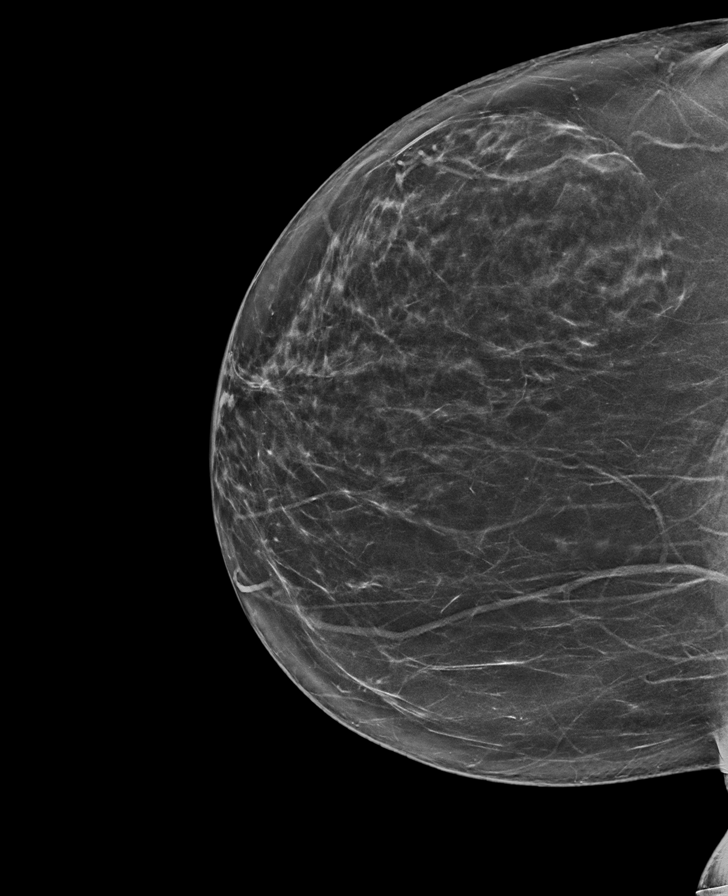

[R MLO tomo · tomo slice 43/86.0]
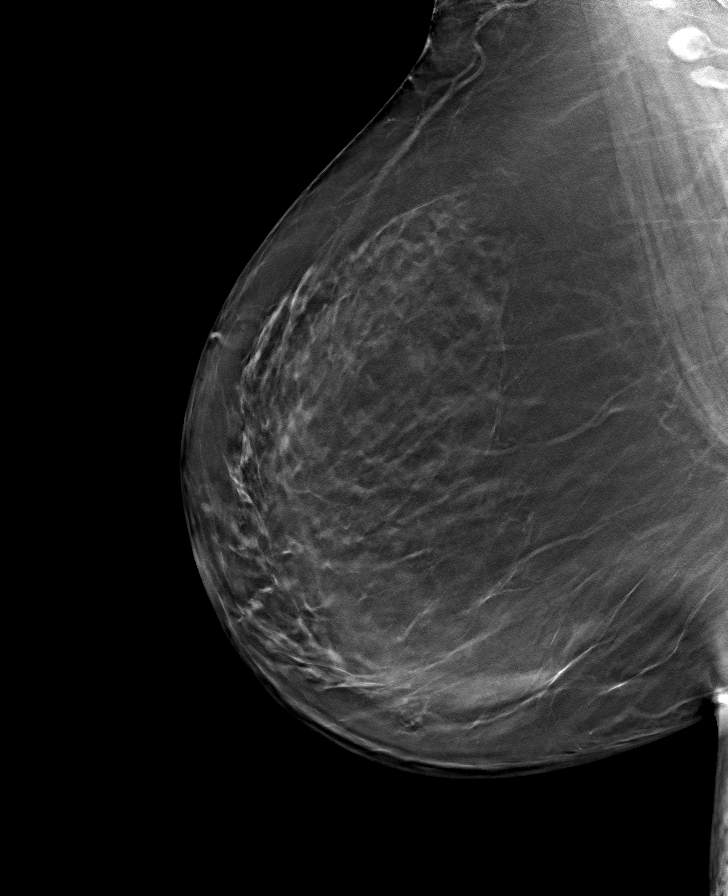

[L MLO tomo · tomo slice 45/89.0]
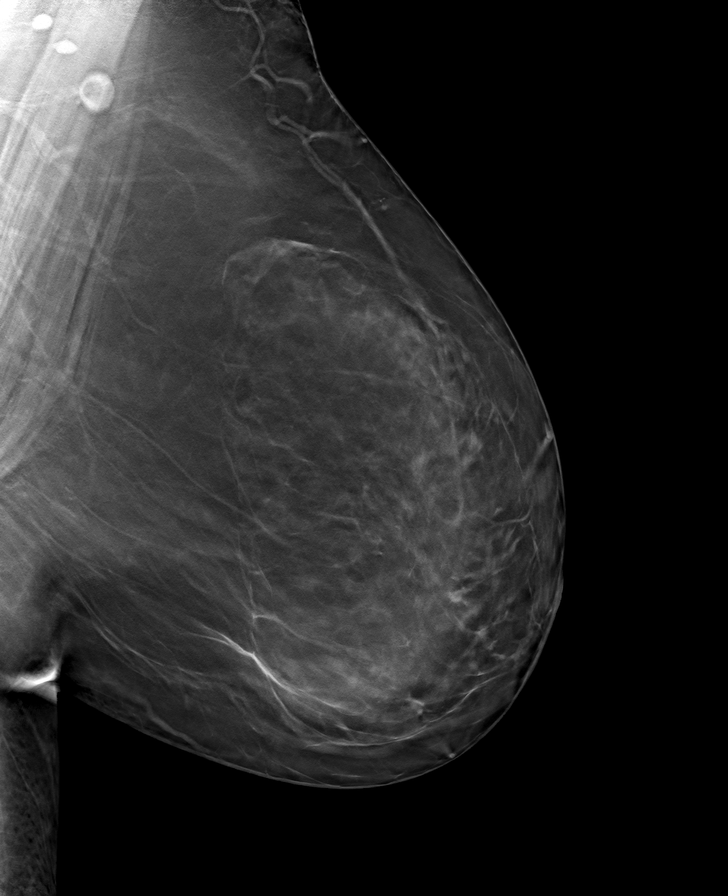

[R CC tomo · tomo slice 38/75.0]
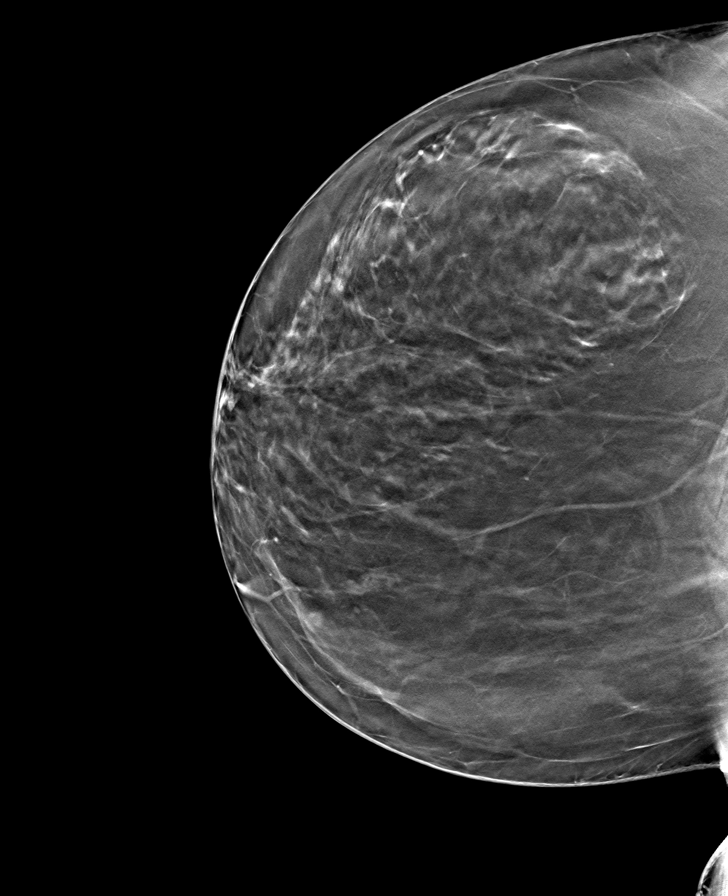

[L CC tomo · tomo slice 37/72.0]
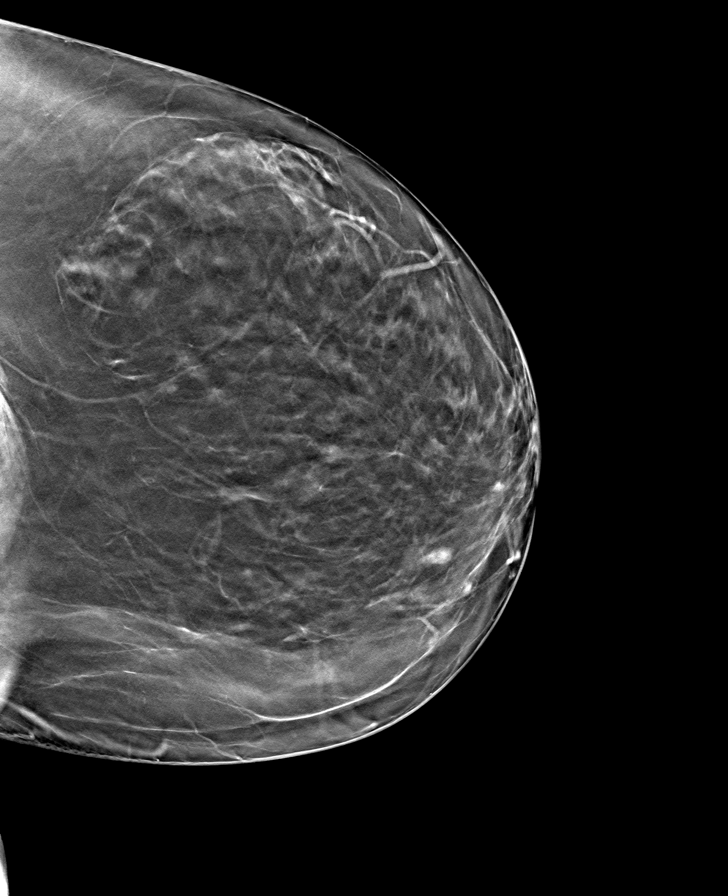

[8 of 24 positions shown; findings below may reference images not displayed]

ACR Breast Density Category b: There are scattered areas of
fibroglandular density.
FINDINGS: There are no findings suspicious for malignancy.
IMPRESSION: No mammographic evidence of malignancy. A result letter of this
screening mammogram will be mailed directly to the patient.

RECOMMENDATION:
Screening mammogram in one year. (Code:51-O-LD2)

BI-RADS CATEGORY  1: Negative.

## 2022-03-27 ENCOUNTER — Other Ambulatory Visit: Payer: Self-pay | Admitting: Primary Care

## 2022-03-27 DIAGNOSIS — Z1231 Encounter for screening mammogram for malignant neoplasm of breast: Secondary | ICD-10-CM

## 2022-04-25 ENCOUNTER — Ambulatory Visit
Admission: RE | Admit: 2022-04-25 | Discharge: 2022-04-25 | Disposition: A | Discharge status: Home / Self Care | Payer: BC Managed Care – PPO | Source: Ambulatory Visit | Attending: Primary Care | Admitting: Primary Care

## 2022-04-25 DIAGNOSIS — Z1231 Encounter for screening mammogram for malignant neoplasm of breast: Secondary | ICD-10-CM | POA: Insufficient documentation

## 2022-09-13 DIAGNOSIS — Z1389 Encounter for screening for other disorder: Secondary | ICD-10-CM | POA: Diagnosis not present

## 2022-09-13 DIAGNOSIS — E119 Type 2 diabetes mellitus without complications: Secondary | ICD-10-CM | POA: Diagnosis not present

## 2022-11-23 DIAGNOSIS — M7090 Unspecified soft tissue disorder related to use, overuse and pressure of unspecified site: Secondary | ICD-10-CM | POA: Diagnosis not present

## 2022-11-23 DIAGNOSIS — J4530 Mild persistent asthma, uncomplicated: Secondary | ICD-10-CM | POA: Diagnosis not present

## 2022-11-23 DIAGNOSIS — Z0000 Encounter for general adult medical examination without abnormal findings: Secondary | ICD-10-CM | POA: Diagnosis not present

## 2022-11-23 DIAGNOSIS — J3089 Other allergic rhinitis: Secondary | ICD-10-CM | POA: Diagnosis not present

## 2022-12-01 DIAGNOSIS — Z1211 Encounter for screening for malignant neoplasm of colon: Secondary | ICD-10-CM | POA: Diagnosis not present

## 2022-12-13 LAB — COLOGUARD: COLOGUARD: NEGATIVE

## 2022-12-13 LAB — EXTERNAL GENERIC LAB PROCEDURE: COLOGUARD: NEGATIVE

## 2023-02-11 DIAGNOSIS — J4530 Mild persistent asthma, uncomplicated: Secondary | ICD-10-CM | POA: Diagnosis not present

## 2023-02-11 DIAGNOSIS — Z0000 Encounter for general adult medical examination without abnormal findings: Secondary | ICD-10-CM | POA: Diagnosis not present

## 2023-02-11 DIAGNOSIS — D519 Vitamin B12 deficiency anemia, unspecified: Secondary | ICD-10-CM | POA: Diagnosis not present

## 2023-02-11 DIAGNOSIS — Z124 Encounter for screening for malignant neoplasm of cervix: Secondary | ICD-10-CM | POA: Diagnosis not present

## 2023-02-11 DIAGNOSIS — Z1389 Encounter for screening for other disorder: Secondary | ICD-10-CM | POA: Diagnosis not present

## 2023-02-11 DIAGNOSIS — E559 Vitamin D deficiency, unspecified: Secondary | ICD-10-CM | POA: Diagnosis not present

## 2023-02-11 DIAGNOSIS — F332 Major depressive disorder, recurrent severe without psychotic features: Secondary | ICD-10-CM | POA: Diagnosis not present

## 2023-02-12 ENCOUNTER — Other Ambulatory Visit: Payer: Self-pay | Admitting: Primary Care

## 2023-02-12 DIAGNOSIS — Z1231 Encounter for screening mammogram for malignant neoplasm of breast: Secondary | ICD-10-CM

## 2023-03-03 DIAGNOSIS — R21 Rash and other nonspecific skin eruption: Secondary | ICD-10-CM | POA: Diagnosis not present

## 2023-04-29 ENCOUNTER — Ambulatory Visit
Admission: RE | Admit: 2023-04-29 | Discharge: 2023-04-29 | Disposition: A | Discharge status: Home / Self Care | Payer: BC Managed Care – PPO | Source: Ambulatory Visit | Attending: Primary Care | Admitting: Primary Care

## 2023-04-29 DIAGNOSIS — Z1231 Encounter for screening mammogram for malignant neoplasm of breast: Secondary | ICD-10-CM | POA: Diagnosis not present

## 2023-05-21 DIAGNOSIS — E119 Type 2 diabetes mellitus without complications: Secondary | ICD-10-CM | POA: Diagnosis not present

## 2023-05-21 DIAGNOSIS — Z712 Person consulting for explanation of examination or test findings: Secondary | ICD-10-CM | POA: Diagnosis not present

## 2023-05-21 DIAGNOSIS — Z1389 Encounter for screening for other disorder: Secondary | ICD-10-CM | POA: Diagnosis not present

## 2023-05-21 DIAGNOSIS — L989 Disorder of the skin and subcutaneous tissue, unspecified: Secondary | ICD-10-CM | POA: Diagnosis not present

## 2023-05-23 ENCOUNTER — Other Ambulatory Visit: Payer: Self-pay | Admitting: Family Medicine

## 2023-05-23 DIAGNOSIS — L989 Disorder of the skin and subcutaneous tissue, unspecified: Secondary | ICD-10-CM

## 2023-05-30 ENCOUNTER — Ambulatory Visit
Admission: RE | Admit: 2023-05-30 | Discharge: 2023-05-30 | Disposition: A | Discharge status: Home / Self Care | Payer: BC Managed Care – PPO | Source: Ambulatory Visit | Attending: Family Medicine | Admitting: Family Medicine

## 2023-05-30 DIAGNOSIS — R2242 Localized swelling, mass and lump, left lower limb: Secondary | ICD-10-CM | POA: Diagnosis not present

## 2023-05-30 DIAGNOSIS — L989 Disorder of the skin and subcutaneous tissue, unspecified: Secondary | ICD-10-CM | POA: Insufficient documentation

## 2023-06-02 ENCOUNTER — Ambulatory Visit: Payer: BC Managed Care – PPO

## 2023-06-11 DIAGNOSIS — L989 Disorder of the skin and subcutaneous tissue, unspecified: Secondary | ICD-10-CM | POA: Diagnosis not present

## 2023-06-11 DIAGNOSIS — Z712 Person consulting for explanation of examination or test findings: Secondary | ICD-10-CM | POA: Diagnosis not present

## 2023-06-11 DIAGNOSIS — L209 Atopic dermatitis, unspecified: Secondary | ICD-10-CM | POA: Diagnosis not present

## 2023-06-11 DIAGNOSIS — Z1389 Encounter for screening for other disorder: Secondary | ICD-10-CM | POA: Diagnosis not present

## 2023-06-11 DIAGNOSIS — M25562 Pain in left knee: Secondary | ICD-10-CM | POA: Diagnosis not present

## 2023-06-11 DIAGNOSIS — G4700 Insomnia, unspecified: Secondary | ICD-10-CM | POA: Diagnosis not present

## 2023-11-03 DIAGNOSIS — E119 Type 2 diabetes mellitus without complications: Secondary | ICD-10-CM | POA: Diagnosis not present

## 2023-11-26 DIAGNOSIS — Z712 Person consulting for explanation of examination or test findings: Secondary | ICD-10-CM | POA: Diagnosis not present

## 2023-11-26 DIAGNOSIS — D519 Vitamin B12 deficiency anemia, unspecified: Secondary | ICD-10-CM | POA: Diagnosis not present

## 2023-11-26 DIAGNOSIS — G43909 Migraine, unspecified, not intractable, without status migrainosus: Secondary | ICD-10-CM | POA: Diagnosis not present

## 2023-11-26 DIAGNOSIS — Z1389 Encounter for screening for other disorder: Secondary | ICD-10-CM | POA: Diagnosis not present

## 2023-11-26 DIAGNOSIS — E119 Type 2 diabetes mellitus without complications: Secondary | ICD-10-CM | POA: Diagnosis not present

## 2023-11-26 DIAGNOSIS — E559 Vitamin D deficiency, unspecified: Secondary | ICD-10-CM | POA: Diagnosis not present

## 2024-03-14 ENCOUNTER — Other Ambulatory Visit: Payer: Self-pay

## 2024-03-14 ENCOUNTER — Emergency Department
Admission: EM | Admit: 2024-03-14 | Discharge: 2024-03-14 | Disposition: A | Discharge status: Home / Self Care | Attending: Emergency Medicine | Admitting: Emergency Medicine

## 2024-03-14 ENCOUNTER — Emergency Department

## 2024-03-14 DIAGNOSIS — J45909 Unspecified asthma, uncomplicated: Secondary | ICD-10-CM | POA: Diagnosis not present

## 2024-03-14 DIAGNOSIS — R0789 Other chest pain: Secondary | ICD-10-CM | POA: Insufficient documentation

## 2024-03-14 DIAGNOSIS — R079 Chest pain, unspecified: Secondary | ICD-10-CM | POA: Diagnosis not present

## 2024-03-14 DIAGNOSIS — R1013 Epigastric pain: Secondary | ICD-10-CM | POA: Insufficient documentation

## 2024-03-14 LAB — BASIC METABOLIC PANEL WITH GFR
Anion gap: 10 (ref 5–15)
BUN: 18 mg/dL (ref 8–23)
CO2: 21 mmol/L — ABNORMAL LOW (ref 22–32)
Calcium: 9.4 mg/dL (ref 8.9–10.3)
Chloride: 108 mmol/L (ref 98–111)
Creatinine, Ser: 0.81 mg/dL (ref 0.44–1.00)
GFR, Estimated: >60 mL/min (ref >60)
Glucose, Bld: 125 mg/dL — ABNORMAL HIGH (ref 70–99)
Potassium: 3.6 mmol/L (ref 3.5–5.1)
Sodium: 139 mmol/L (ref 135–145)

## 2024-03-14 LAB — HEPATIC FUNCTION PANEL
ALT: 17 U/L (ref 0–44)
AST: 24 U/L (ref 15–41)
Albumin: 4 g/dL (ref 3.5–5.0)
Alkaline Phosphatase: 94 U/L (ref 38–126)
Bilirubin, Direct: <0.1 mg/dL (ref 0.0–0.2)
Total Bilirubin: 0.7 mg/dL (ref 0.0–1.2)
Total Protein: 7.6 g/dL (ref 6.5–8.1)

## 2024-03-14 LAB — LIPASE, BLOOD: Lipase: 44 U/L (ref 11–51)

## 2024-03-14 LAB — CBC
HCT: 41.1 % (ref 36.0–46.0)
Hemoglobin: 13.4 g/dL (ref 12.0–15.0)
MCH: 27.6 pg (ref 26.0–34.0)
MCHC: 32.6 g/dL (ref 30.0–36.0)
MCV: 84.7 fL (ref 80.0–100.0)
Platelets: 245 K/uL (ref 150–400)
RBC: 4.85 MIL/uL (ref 3.87–5.11)
RDW: 14.3 % (ref 11.5–15.5)
WBC: 6.6 K/uL (ref 4.0–10.5)
nRBC: 0 % (ref 0.0–0.2)

## 2024-03-14 LAB — TROPONIN I (HIGH SENSITIVITY)
Troponin I (High Sensitivity): 3 ng/L (ref <18)
Troponin I (High Sensitivity): 2 ng/L (ref <18)

## 2024-03-14 LAB — D-DIMER, QUANTITATIVE: D-Dimer, Quant: 0.27 ug{FEU}/mL (ref 0.00–0.50)

## 2024-03-14 MED ORDER — ALPRAZOLAM 0.25 MG PO TABS
0.2500 mg | ORAL_TABLET | Freq: Once | ORAL | Status: AC
  Administered 2024-03-14: 0.25 mg via ORAL
  Filled 2024-03-14: qty 1

## 2024-03-14 NOTE — ED Provider Notes (Signed)
 Patient and her daughter comfortable with plan for discharge.  Reviewed evaluation and testing to this point.  She is comfortable does feel better after taking alprazolam .  She is not driving.  She has a primary care home she can follow-up with and is also agreeable to follow-up with cardiology.  She reports she had a stress test in 2022 but did not complete it fully due to difficulty with the exertional component of the test.  She is resting comfortably, I am suspicious based on the history she provides this could be more of a gastritis or epigastric type discomfort.  She reports previous cholecystectomy.  Abdomen she reports very mild discomfort in epigastrium but no focal right upper quadrant pain.  She reports the chest discomfort has improved  Return precautions and treatment recommendations and follow-up discussed with the patient who is agreeable with the plan.   Dicky Anes, MD 03/14/24 [phone]

## 2024-03-14 NOTE — ED Triage Notes (Signed)
 Patient wheeled to triage with complaints of chest pain x3-4 days. Patient states pain was so severe this morning it woke her up out of her sleep. Endorses tingling in limbs and shortness of breath as accompanying symptoms.

## 2024-03-14 NOTE — ED Provider Notes (Signed)
 [facility] Provider Note    Event Date/Time   First MD Initiated Contact with Patient 03/14/24 [phone]     (approximate)   History   Chest Pain   HPI  Vicki Ellis is a 63 y.o. female with a history of asthma and CVA who presents with chest pain for the last 2 days, initially intermittent and now more constant.  It is described as squeezing and is substernal in location.  She reports some associated shortness of breath as well as epigastric pain.  She states she feels lightheaded and weak.  She also has a dry mouth but no nausea or vomiting.  She denies any prior history of pain exactly like this although did have some chest discomfort and palpitations a few years ago for which she was seen by cardiology.  She denies any leg pain or swelling.  She has no cough or fever.  I reviewed the past medical records.  The patient's most recent outpatient encounters in our system are from 2022.  The patient saw cardiology in November 2022 for palpitations and was started on propranolol.   Physical Exam   Triage Vital Signs: ED Triage Vitals  Encounter Vitals Group     BP 03/14/24 0549 (!) 141/79     Girls Systolic BP Percentile --      Girls Diastolic BP Percentile --      Boys Systolic BP Percentile --      Boys Diastolic BP Percentile --      Pulse Rate 03/14/24 0549 91     Resp 03/14/24 0549 16     Temp 03/14/24 0549 97.7 F (36.5 C)     Temp src --      SpO2 03/14/24 0549 99 %     Weight 03/14/24 0547 140 lb (63.5 kg)     Height 03/14/24 0547 5' 1 (1.549 m)     Head Circumference --      Peak Flow --      Pain Score 03/14/24 0547 9     Pain Loc --      Pain Education --      Exclude from Growth Chart --     Most recent vital signs: Vitals:   03/14/24 0549  BP: (!) 141/79  Pulse: 91  Resp: 16  Temp: 97.7 F (36.5 C)  SpO2: 99%     General: Awake, no distress.  CV:  Good peripheral perfusion.  Resp:  Normal effort.  Lungs CTAB. Abd:  Mild  epigastric tenderness.  No distention.  Other:  No peripheral edema.   ED Results / Procedures / Treatments   Labs (all labs ordered are listed, but only abnormal results are displayed) Labs Reviewed  BASIC METABOLIC PANEL WITH GFR - Abnormal; Notable for the following components:      Result Value   CO2 21 (*)    Glucose, Bld 125 (*)    All other components within normal limits  CBC  HEPATIC FUNCTION PANEL  LIPASE, BLOOD  D-DIMER, QUANTITATIVE  TROPONIN I (HIGH SENSITIVITY)     EKG  ED ECG REPORT I, Waylon Cassis, the attending physician, personally viewed and interpreted this ECG.  Date: 03/14/2024 EKG Time: 0549 Rate: 88 Rhythm: normal sinus rhythm QRS Axis: normal Intervals: normal ST/T Wave abnormalities: Nonspecific T wave abnormalities Narrative Interpretation: no evidence of acute ischemia    RADIOLOGY  Chest x-ray: I independently viewed and interpreted the images; there is no focal consolidation or edema  PROCEDURES:  Critical Care performed: No  Procedures   MEDICATIONS ORDERED IN ED: Medications - No data to display   IMPRESSION / MDM / ASSESSMENT AND PLAN / ED COURSE  I reviewed the triage vital signs and the nursing notes.  63 year old female with PMH as noted above who presents with somewhat atypical, nonexertional chest and epigastric pain over the last 2 days, becoming constant this morning.  Physical exam is unremarkable for acute findings.  EKG is nonischemic.  Differential diagnosis includes, but is not limited to, ACS, musculoskeletal pain, GERD, gastritis, less likely PE, pancreatitis, other hepatobiliary cause.  There is no evidence of aortic dissection or other vascular etiology.  We will obtain lab workup, including cardiac enzymes, D-dimer, and LFTs, chest x-ray, and reassess.  Patient's presentation is most consistent with acute complicated illness / injury requiring diagnostic workup.  The patient is on the cardiac  monitor to evaluate for evidence of arrhythmia and/or significant heart rate changes.  ----------------------------------------- 7:11 AM on 03/14/2024 -----------------------------------------  Chest x-ray, BNP, CBC, and initial troponin are all unremarkable.  The patient is still pending LFTs, lipase, and D-dimer.  I have signed out her care to the oncoming ED physician Dr. Dicky.   FINAL CLINICAL IMPRESSION(S) / ED DIAGNOSES   Final diagnoses:  Atypical chest pain     Rx / DC Orders   ED Discharge Orders     None        Note:  This document was prepared using Dragon voice recognition software and may include unintentional dictation errors.    Jacolyn Pae, MD 03/14/24 [phone]

## 2024-03-30 ENCOUNTER — Other Ambulatory Visit: Payer: Self-pay | Admitting: Primary Care

## 2024-03-30 DIAGNOSIS — Z1231 Encounter for screening mammogram for malignant neoplasm of breast: Secondary | ICD-10-CM

## 2024-04-29 ENCOUNTER — Ambulatory Visit
Admission: RE | Admit: 2024-04-29 | Discharge: 2024-04-29 | Disposition: A | Discharge status: Home / Self Care | Source: Ambulatory Visit | Attending: Primary Care | Admitting: Primary Care

## 2024-04-29 DIAGNOSIS — Z1231 Encounter for screening mammogram for malignant neoplasm of breast: Secondary | ICD-10-CM | POA: Diagnosis not present

## 2024-05-06 NOTE — Progress Notes (Unsigned)
  Cardiology Office Note   Date:  05/07/2024  ID:  Vicki Ellis, DOB 1961/06/24, MRN [id number] PCP: [name], NP  [facility] Providers Cardiologist:  Caron Poser, MD     History of Present Illness Vicki Ellis is a 63 y.o. female PMH asthma, prior CVA (2017) who presents for further evaluation management of chest discomfort.  Patient was seen in the ED 02/2024 for chest discomfort.  Serial troponins were negative.  D-dimer is negative.  She reportedly attempted a stress test in 2022 but was unable to complete the exercise portion of it.  Details are unclear.  Last LDL 77 in 2017.  Patient reports pressure-like chest discomfort.  She also notes radiation to her neck and left shoulder.  This is accompanied by dyspnea.  She says this symptom happens sometimes at rest, but predominantly with exertion.  She also has intermittent palpitations.  She also notes she just feels more fatigued than she has and is able to do less physical activity.  Denies any syncope.  Denies any lower extremity edema.  No orthopnea.  Relevant CVD History -TTE 06/2021 normal biventricular function and no hemodynamically significant valvular disease -Carotid Doppler 2017 less than 50% bilateral stenosis -TTE 08/2015 normal biventricular function, grade 1 diastolic dysfunction, no valvular disease   ROS: Pt denies any syncope, presyncope, orthopnea, PND, or LE edema.  Studies Reviewed I have independently reviewed the patient's ECG, previous medical records, previous cardiac testing, and recent blood work.  Physical Exam VS:  BP 122/76 (BP Location: Left Arm, Patient Position: Sitting, Cuff Size: Normal)   Pulse 76   Ht 5' 2 (1.575 m)   Wt 141 lb (64 kg)   LMP 05/22/2015   SpO2 97%   BMI 25.79 kg/m        Wt Readings from Last 3 Encounters:  05/07/24 141 lb (64 kg)  03/14/24 140 lb (63.5 kg)  06/02/17 148 lb (67.1 kg)    GEN: No acute distress. NECK: No JVD; No carotid bruits. CARDIAC:  RRR, no murmurs, rubs, gallops. RESPIRATORY:  Clear to auscultation. EXTREMITIES:  Warm and well-perfused. No edema.  ASSESSMENT AND PLAN Chest discomfort DOE Patient presents with predominantly exertional chest discomfort that does have cardiac features.  She also has dyspnea on exertion.  She has cardiac risk factors including HLD and a prior CVA.  Plan: - Coronary CT angiogram; she will need premedication protocol as she developed hives with contrast administration during CT scans in 2017 - Echocardiogram given dyspnea - Start aspirin  81 mg daily - Start Crestor  10 mg daily - Recheck lipids at next visit; LDL goal less than 55  Prior CVA HLD Patient had a small stroke in 2017. She does not have any residual defects.  Given her prior event, her LDL goal is less than 55.  Plan: - As above, start ASA 81 mg daily and Crestor  10 mg daily  Palpitations Occurring every other day or so.  Short-lived episodes.  No significant dizziness or syncope.  Discussed obtaining a monitor; she would like to obtain the other testing first.  I advised her to reach back out if she has more symptoms and we will send her monitor to evaluate for arrhythmia.        Dispo: RTC 3 months after cardiac testing  Signed, Caron Poser, MD

## 2024-05-07 ENCOUNTER — Ambulatory Visit

## 2024-05-07 VITALS — BP 122/76 | HR 76 | Ht 62.0 in | Wt 141.0 lb

## 2024-05-07 DIAGNOSIS — R079 Chest pain, unspecified: Secondary | ICD-10-CM | POA: Diagnosis not present

## 2024-05-07 DIAGNOSIS — R072 Precordial pain: Secondary | ICD-10-CM

## 2024-05-07 DIAGNOSIS — Z8673 Personal history of transient ischemic attack (TIA), and cerebral infarction without residual deficits: Secondary | ICD-10-CM

## 2024-05-07 DIAGNOSIS — Z91041 Radiographic dye allergy status: Secondary | ICD-10-CM

## 2024-05-07 MED ORDER — PREDNISONE 50 MG PO TABS: ORAL_TABLET | ORAL | 0 refills | Status: AC

## 2024-05-07 MED ORDER — ASPIRIN EC 81 MG PO TBEC: 81.0000 mg | DELAYED_RELEASE_TABLET | Freq: Every day | ORAL | Status: DC | PRN

## 2024-05-07 MED ORDER — DIPHENHYDRAMINE HCL 50 MG PO TABS: ORAL_TABLET | ORAL | 0 refills | Status: AC

## 2024-05-07 MED ORDER — METOPROLOL TARTRATE 100 MG PO TABS: ORAL_TABLET | ORAL | 0 refills | Status: DC

## 2024-05-07 MED ORDER — ROSUVASTATIN CALCIUM 10 MG PO TABS: 10.0000 mg | ORAL_TABLET | Freq: Every day | ORAL | 3 refills | Status: DC

## 2024-05-07 NOTE — Patient Instructions (Addendum)
 Medication Instructions:  START Aspirin  81 mg once daily   START Rosuvastatin  10 mg once daily   *If you need a refill on your cardiac medications before your next appointment, please call your pharmacy*  Lab Work: Your provider would like for you to have the following labs today: BMET  If you have labs (blood work) drawn today and your tests are completely normal, you will receive your results only by: MyChart Message (if you have MyChart) OR A paper copy in the mail If you have any lab test that is abnormal or we need to change your treatment, we will call you to review the results.  Testing/Procedures: Your physician has requested that you have an echocardiogram. Echocardiography is a painless test that uses sound waves to create images of your heart. It provides your doctor with information about the size and shape of your heart and how well your heart's chambers and valves are working.   You may receive an ultrasound enhancing agent through an IV if needed to better visualize your heart during the echo. This procedure takes approximately one hour.  There are no restrictions for this procedure.  This will take place at [address] [facility]) [address], [place] [address]  Please note: We ask at that you not bring children with you during ultrasound (echo/ vascular) testing. Due to room size and safety concerns, children are not allowed in the ultrasound rooms during exams. Our front office staff cannot provide observation of children in our lobby area while testing is being conducted. An adult accompanying a patient to their appointment will only be allowed in the ultrasound room at the discretion of the ultrasound technician under special circumstances. We apologize for any inconvenience.   Follow-Up: At [facility], you and your health needs are our priority.  As part of our continuing mission to provide you with exceptional heart care, our providers are all  part of one team.  This team includes your primary Cardiologist (physician) and Advanced Practice Providers or APPs (Physician Assistants and Nurse Practitioners) who all work together to provide you with the care you need, when you need it.  Your next appointment:   3 month(s)  Provider:   Caron Poser, MD    We recommend signing up for the patient portal called MyChart.  Sign up information is provided on this After Visit Summary.  MyChart is used to connect with patients for Virtual Visits (Telemedicine).  Patients are able to view lab/test results, encounter notes, upcoming appointments, etc.  Non-urgent messages can be sent to your provider as well.   To learn more about what you can do with MyChart, go to [email or web address].   Other Instructions   Your cardiac CT will be scheduled at one of the below locations:   [facility] [address] [place], [place] [address] [phone]  If scheduled at [facility], please arrive to the Heart and Vascular Center 15 mins early for check-in and test prep.  There is spacious parking and easy access to the radiology department from the [facility] entrance. Please enter here and check-in with the desk attendant.   Please follow these instructions carefully (unless otherwise directed):  An IV will be required for this test and Nitroglycerin will be given.   On the Night Before the Test: Be sure to Drink plenty of water. Do not consume any caffeinated/decaffeinated beverages or chocolate 12 hours prior to your test. Do not  take any antihistamines 12 hours prior to your test. Take as instructed: Prednisone  50 mg - take 13 hours prior to test Take another Prednisone  50 mg 7 hours prior to test Take another Prednisone  50 mg 1 hour prior to test Take Benadryl  50 mg 1 hour prior to test Patient must complete all four doses of above prophylactic medications. Patient will need  a ride after test due to Benadryl .  On the Day of the Test: Drink plenty of water until 1 hour prior to the test. Do not eat any food 1 hour prior to test. You may take your regular medications prior to the test.  Take metoprolol  (Lopressor ) two hours prior to test. Patients who wear a continuous glucose monitor MUST remove the device prior to scanning. FEMALES- please wear underwire-free bra if available, avoid dresses & tight clothing       After the Test: Drink plenty of water. After receiving IV contrast, you may experience a mild flushed feeling. This is normal. On occasion, you may experience a mild rash up to 24 hours after the test. This is not dangerous. If this occurs, you can take Benadryl  25 mg, Zyrtec, Claritin, or Allegra and increase your fluid intake. (Patients taking Tikosyn should avoid Benadryl , and may take Zyrtec, Claritin, or Allegra) If you experience trouble breathing, this can be serious. If it is severe call 911 IMMEDIATELY. If it is mild, please call our office.  We will call to schedule your test 2-4 weeks out understanding that some insurance companies will need an authorization prior to the service being performed.   For more information and frequently asked questions, please visit our website : [email or web address]  For non-scheduling related questions, please contact the cardiac imaging nurse navigator should you have any questions/concerns: Cardiac Imaging Nurse Navigators Direct Office Dial: [phone]   For scheduling needs, including cancellations and rescheduling, please call [place], [phone].

## 2024-05-07 NOTE — Addendum Note (Signed)
 Addended by: ESTELLE OLAM GRADE on: 05/07/2024 07:24 PM   Modules accepted: Orders

## 2024-05-08 ENCOUNTER — Ambulatory Visit: Payer: Self-pay

## 2024-05-08 LAB — BASIC METABOLIC PANEL WITH GFR
BUN/Creatinine Ratio: 9 — ABNORMAL LOW (ref 12–28)
BUN: 8 mg/dL (ref 8–27)
CO2: 19 mmol/L — ABNORMAL LOW (ref 20–29)
Calcium: 9.5 mg/dL (ref 8.7–10.3)
Chloride: 104 mmol/L (ref 96–106)
Creatinine, Ser: 0.85 mg/dL (ref 0.57–1.00)
Glucose: 85 mg/dL (ref 70–99)
Potassium: 4.2 mmol/L (ref 3.5–5.2)
Sodium: 140 mmol/L (ref 134–144)
eGFR: 78 mL/min/1.73 (ref >59)

## 2024-06-02 ENCOUNTER — Telehealth (HOSPITAL_COMMUNITY): Payer: Self-pay | Admitting: Emergency Medicine

## 2024-06-02 NOTE — Telephone Encounter (Signed)
 Attempted to call patient regarding upcoming cardiac CT appointment. Left message on voicemail with name and callback number Rockwell Alexandria RN Navigator Cardiac Imaging [facility] [phone] Office [phone] Cell

## 2024-06-02 NOTE — Telephone Encounter (Signed)
 Reaching out to patient to offer assistance regarding upcoming cardiac imaging study; pt verbalizes understanding of appt date/time, parking situation and where to check in, pre-test NPO status and medications ordered, and verified current allergies; name and call back number provided for further questions should they arise Camie Shutter RN Navigator Cardiac Imaging [facility] [phone] office [phone] cell   13 hrs prep and metoprolol  reviewed

## 2024-06-03 ENCOUNTER — Ambulatory Visit
Admission: RE | Admit: 2024-06-03 | Discharge: 2024-06-03 | Disposition: A | Discharge status: Home / Self Care | Source: Ambulatory Visit

## 2024-06-03 DIAGNOSIS — R072 Precordial pain: Secondary | ICD-10-CM | POA: Insufficient documentation

## 2024-06-03 MED ORDER — NITROGLYCERIN 0.4 MG SL SUBL
0.8000 mg | SUBLINGUAL_TABLET | Freq: Once | SUBLINGUAL | Status: AC
  Administered 2024-06-03: 0.8 mg via SUBLINGUAL
  Filled 2024-06-03: qty 25

## 2024-06-03 MED ORDER — DILTIAZEM HCL 25 MG/5ML IV SOLN: 10.0000 mg | INTRAVENOUS | Status: DC | PRN

## 2024-06-03 MED ORDER — IOHEXOL 350 MG/ML SOLN
100.0000 mL | Freq: Once | INTRAVENOUS | Status: AC | PRN
  Administered 2024-06-03: 100 mL via INTRAVENOUS

## 2024-06-03 MED ORDER — METOPROLOL TARTRATE 5 MG/5ML IV SOLN
10.0000 mg | Freq: Once | INTRAVENOUS | Status: AC | PRN
  Administered 2024-06-03: 10 mg via INTRAVENOUS

## 2024-06-03 NOTE — Progress Notes (Signed)
 Pt became very nervous during exam and moved when contrast was given.  Pt taken out of scanner. Pt updated on the pictures and the readings won't be good for the doctor. Pt states she doesn't wan to try again. Pt tearful and upset.  Pt ABCs intact. Pt denies any complaints. Pt encouraged to drink plenty of water throughout the day. Pt ambulatory with steady gait.

## 2024-06-29 ENCOUNTER — Ambulatory Visit

## 2024-07-30 ENCOUNTER — Ambulatory Visit

## 2024-08-04 ENCOUNTER — Ambulatory Visit

## 2024-08-25 ENCOUNTER — Ambulatory Visit

## 2024-08-25 DIAGNOSIS — R079 Chest pain, unspecified: Secondary | ICD-10-CM | POA: Diagnosis not present

## 2024-08-25 LAB — ECHOCARDIOGRAM COMPLETE
AR max vel: 2.76 cm2
AV Area VTI: 2.89 cm2
AV Area mean vel: 2.68 cm2
AV Mean grad: 3 mmHg
AV Peak grad: 5.9 mmHg
Ao pk vel: 1.21 m/s
Area-P 1/2: 4.6 cm2
S' Lateral: 2.3 cm

## 2024-08-26 NOTE — Progress Notes (Signed)
 " Cardiology Office Note   Date:  08/27/2024  ID:  Vicki Ellis, DOB 08-Apr-1961, MRN [id number] PCP: [name], NP  [facility] Providers Cardiologist:  Caron Poser, MD     History of Present Illness Vicki Ellis is a 64 y.o. female PMH asthma, prior CVA (2017) who presents for further evaluation management of chest discomfort.  Patient was seen in the ED 02/2024 for chest discomfort.  Serial troponins were negative.  D-dimer is negative.  She reportedly attempted a stress test in 2022 but was unable to complete the exercise portion of it.  Details are unclear.  Last LDL 77 in 2017.  Patient reports pressure-like chest discomfort.  She also notes radiation to her neck and left shoulder.  This is accompanied by dyspnea.  She says this symptom happens sometimes at rest, but predominantly with exertion.  She also has intermittent palpitations.  She also notes she just feels more fatigued than she has and is able to do less physical activity.  Denies any syncope.  Denies any lower extremity edema.  No orthopnea.  Interval history: Since last visit, we obtained a coronary CT angiogram and echocardiogram which were essentially normal.  Still intermittently having chest discomfort, but she notes that it seems to happen during times of stress/stress.  She is otherwise doing well.  Relevant CVD History -TTE 08/25/2024 normal biventricular function, grade 1 diastolic dysfunction, mild MR -Coronary CTA 05/2024 CAD RADS 0 -TTE 06/2021 normal biventricular function and no hemodynamically significant valvular disease -Carotid Doppler 2017 less than 50% bilateral stenosis -TTE 08/2015 normal biventricular function, grade 1 diastolic dysfunction, no valvular disease   ROS: Pt denies any syncope, presyncope, orthopnea, PND, or LE edema.  Studies Reviewed I have independently reviewed the patient's ECG, previous medical records, previous cardiac testing, and recent blood work.  Physical  Exam VS:  BP 114/80 (BP Location: Left Arm, Patient Position: Sitting, Cuff Size: Normal)   Pulse 96   Ht 5' 1 (1.549 m)   Wt 138 lb (62.6 kg)   LMP 05/22/2015   SpO2 98%   BMI 26.07 kg/m        Wt Readings from Last 3 Encounters:  08/27/24 138 lb (62.6 kg)  05/07/24 141 lb (64 kg)  03/14/24 140 lb (63.5 kg)    GEN: No acute distress. NECK: No JVD; No carotid bruits. CARDIAC: RRR, no murmurs, rubs, gallops. RESPIRATORY:  Clear to auscultation. EXTREMITIES:  Warm and well-perfused. No edema.  ASSESSMENT AND PLAN Chest discomfort DOE Patient presents with predominantly exertional chest discomfort that does have cardiac features.  She also has dyspnea on exertion.  She has cardiac risk factors including HLD and a prior CVA.  Workup has been reassuring with a coronary CTA 05/2024 showing CAD RADS 0 and a normal echocardiogram 08/2024.  Plan: - Continue aspirin  81 mg daily given prior CVA - Continue Crestor  10 mg daily - Recheck lipids today; LDL goal less than 55  Prior CVA HLD Patient had a small stroke in 2017. She does not have any residual defects.  Given her prior event, her LDL goal is less than 55.  Plan: - As above, continue ASA 81 mg daily and Crestor  10 mg daily.  Will recheck lipids and uptitrate Crestor  as needed.  Palpitations Occurring every other day or so.  Short-lived episodes.  No significant dizziness or syncope.  Discussed obtaining a monitor; she would like to obtain the other testing first.  I advised her to reach back out if  she has more symptoms and we will send her monitor to evaluate for arrhythmia.        Dispo: RTC 3 months after cardiac testing  Signed, Caron Poser, MD  "

## 2024-08-27 ENCOUNTER — Ambulatory Visit: Payer: Self-pay

## 2024-08-27 VITALS — BP 114/80 | HR 96 | Ht 61.0 in | Wt 138.0 lb

## 2024-08-27 DIAGNOSIS — I479 Paroxysmal tachycardia, unspecified: Secondary | ICD-10-CM | POA: Diagnosis not present

## 2024-08-27 DIAGNOSIS — E782 Mixed hyperlipidemia: Secondary | ICD-10-CM

## 2024-08-27 DIAGNOSIS — R002 Palpitations: Secondary | ICD-10-CM | POA: Diagnosis not present

## 2024-08-27 DIAGNOSIS — Z8673 Personal history of transient ischemic attack (TIA), and cerebral infarction without residual deficits: Secondary | ICD-10-CM

## 2024-08-27 DIAGNOSIS — R079 Chest pain, unspecified: Secondary | ICD-10-CM

## 2024-08-27 NOTE — Patient Instructions (Signed)
 Medication Instructions:  Your physician recommends that you continue on your current medications as directed. Please refer to the Current Medication list given to you today.  *If you need a refill on your cardiac medications before your next appointment, please call your pharmacy*  Lab Work: Your provider would like for you to have following labs drawn today LIPID PANEL.   If you have labs (blood work) drawn today and your tests are completely normal, you will receive your results only by: MyChart Message (if you have MyChart) OR A paper copy in the mail If you have any lab test that is abnormal or we need to change your treatment, we will call you to review the results.  Testing/Procedures: No test ordered today   Follow-Up: At [facility], you and your health needs are our priority.  As part of our continuing mission to provide you with exceptional heart care, our providers are all part of one team.  This team includes your primary Cardiologist (physician) and Advanced Practice Providers or APPs (Physician Assistants and Nurse Practitioners) who all work together to provide you with the care you need, when you need it.  Your next appointment:   12 month(s)  Provider:   Caron Poser, MD   We recommend signing up for the patient portal called MyChart.  Sign up information is provided on this After Visit Summary.  MyChart is used to connect with patients for Virtual Visits (Telemedicine).  Patients are able to view lab/test results, encounter notes, upcoming appointments, etc.  Non-urgent messages can be sent to your provider as well.   To learn more about what you can do with MyChart, go to [email or web address].

## 2024-08-28 ENCOUNTER — Ambulatory Visit: Payer: Self-pay

## 2024-08-28 LAB — LIPID PANEL
Chol/HDL Ratio: 2.2 ratio (ref 0.0–4.4)
Cholesterol, Total: 157 mg/dL (ref 100–199)
HDL: 71 mg/dL
LDL Chol Calc (NIH): 62 mg/dL (ref 0–99)
Triglycerides: 145 mg/dL (ref 0–149)
VLDL Cholesterol Cal: 24 mg/dL (ref 5–40)

## 2024-08-30 ENCOUNTER — Other Ambulatory Visit: Payer: Self-pay | Admitting: *Deleted

## 2024-09-01 MED ORDER — ROSUVASTATIN CALCIUM 20 MG PO TABS: 20.0000 mg | ORAL_TABLET | Freq: Every day | ORAL | 3 refills | Status: AC
